# Patient Record
Sex: Male | Born: 1965 | Race: White | Hispanic: No
Health system: Midwestern US, Community
[De-identification: ages and names within clinical notes are randomized; demographics above are authoritative.]

## PROBLEM LIST (undated history)

## (undated) DIAGNOSIS — M5459 Other low back pain: Secondary | ICD-10-CM

## (undated) DIAGNOSIS — Z96641 Presence of right artificial hip joint: Principal | ICD-10-CM

## (undated) DIAGNOSIS — Z1211 Encounter for screening for malignant neoplasm of colon: Secondary | ICD-10-CM

## (undated) DIAGNOSIS — M87051 Idiopathic aseptic necrosis of right femur: Secondary | ICD-10-CM

## (undated) DIAGNOSIS — R197 Diarrhea, unspecified: Secondary | ICD-10-CM

## (undated) DIAGNOSIS — E7800 Pure hypercholesterolemia, unspecified: Secondary | ICD-10-CM

## (undated) DIAGNOSIS — M48061 Spinal stenosis, lumbar region without neurogenic claudication: Secondary | ICD-10-CM

## (undated) DIAGNOSIS — Z01810 Encounter for preprocedural cardiovascular examination: Principal | ICD-10-CM

## (undated) DIAGNOSIS — K219 Gastro-esophageal reflux disease without esophagitis: Secondary | ICD-10-CM

## (undated) DIAGNOSIS — Z9889 Other specified postprocedural states: Secondary | ICD-10-CM

## (undated) DIAGNOSIS — R112 Nausea with vomiting, unspecified: Secondary | ICD-10-CM

## (undated) DIAGNOSIS — F909 Attention-deficit hyperactivity disorder, unspecified type: Secondary | ICD-10-CM

## (undated) DIAGNOSIS — F419 Anxiety disorder, unspecified: Secondary | ICD-10-CM

## (undated) DIAGNOSIS — M1990 Unspecified osteoarthritis, unspecified site: Secondary | ICD-10-CM

## (undated) DIAGNOSIS — Z9289 Personal history of other medical treatment: Secondary | ICD-10-CM

## (undated) DIAGNOSIS — J189 Pneumonia, unspecified organism: Secondary | ICD-10-CM

## (undated) DIAGNOSIS — F319 Bipolar disorder, unspecified: Secondary | ICD-10-CM

## (undated) DIAGNOSIS — S069X9A Unspecified intracranial injury with loss of consciousness of unspecified duration, initial encounter: Secondary | ICD-10-CM

## (undated) DIAGNOSIS — S069X1A Unspecified intracranial injury with loss of consciousness of 30 minutes or less, initial encounter: Secondary | ICD-10-CM

## (undated) DIAGNOSIS — Z8489 Family history of other specified conditions: Secondary | ICD-10-CM

## (undated) DIAGNOSIS — Z87442 Personal history of urinary calculi: Secondary | ICD-10-CM

## (undated) DIAGNOSIS — IMO0001 Reserved for inherently not codable concepts without codable children: Secondary | ICD-10-CM

## (undated) HISTORY — PX: KNEE SURGERY: SHX244

## (undated) HISTORY — PX: OTHER SURGICAL HISTORY: SHX169

---
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
## 2011-09-11 ENCOUNTER — Emergency Department (HOSPITAL_COMMUNITY)
Admission: EM | Admit: 2011-09-11 | Discharge: 2011-09-11 | Disposition: A | Discharge status: Home / Self Care | Payer: Medicaid Other | Attending: Emergency Medicine | Admitting: Emergency Medicine

## 2011-09-11 ENCOUNTER — Emergency Department (HOSPITAL_COMMUNITY): Payer: Medicaid Other

## 2011-09-11 ENCOUNTER — Encounter: Payer: Self-pay | Admitting: *Deleted

## 2011-09-11 DIAGNOSIS — F909 Attention-deficit hyperactivity disorder, unspecified type: Secondary | ICD-10-CM | POA: Insufficient documentation

## 2011-09-11 DIAGNOSIS — F319 Bipolar disorder, unspecified: Secondary | ICD-10-CM | POA: Insufficient documentation

## 2011-09-11 DIAGNOSIS — J45909 Unspecified asthma, uncomplicated: Secondary | ICD-10-CM | POA: Insufficient documentation

## 2011-09-11 DIAGNOSIS — N200 Calculus of kidney: Secondary | ICD-10-CM

## 2011-09-11 DIAGNOSIS — R109 Unspecified abdominal pain: Secondary | ICD-10-CM | POA: Insufficient documentation

## 2011-09-11 HISTORY — DX: Bipolar disorder, unspecified: F31.9

## 2011-09-11 HISTORY — DX: Attention-deficit hyperactivity disorder, unspecified type: F90.9

## 2011-09-11 LAB — COMPREHENSIVE METABOLIC PANEL
ALT: 38 U/L (ref 0–53)
AST: 25 U/L (ref 0–37)
Albumin: 3.8 g/dL (ref 3.5–5.2)
Alkaline Phosphatase: 103 U/L (ref 39–117)
BUN: 13 mg/dL (ref 6–23)
Chloride: 100 mEq/L (ref 96–112)
Potassium: 4.4 mEq/L (ref 3.5–5.1)
Sodium: 138 mEq/L (ref 135–145)
Total Bilirubin: 0.2 mg/dL — ABNORMAL LOW (ref 0.3–1.2)
Total Protein: 7.6 g/dL (ref 6.0–8.3)

## 2011-09-11 LAB — URINALYSIS, ROUTINE W REFLEX MICROSCOPIC
Glucose, UA: NEGATIVE mg/dL
Ketones, ur: NEGATIVE mg/dL
Leukocytes, UA: NEGATIVE
Protein, ur: 30 mg/dL — AB
pH: 6 (ref 5.0–8.0)

## 2011-09-11 LAB — DIFFERENTIAL
Basophils Absolute: 0 10*3/uL (ref 0.0–0.1)
Basophils Relative: 0 % (ref 0–1)
Eosinophils Absolute: 0.1 10*3/uL (ref 0.0–0.7)
Monocytes Relative: 9 % (ref 3–12)
Neutro Abs: 5.8 10*3/uL (ref 1.7–7.7)
Neutrophils Relative %: 62 % (ref 43–77)

## 2011-09-11 LAB — CBC
Hemoglobin: 14.4 g/dL (ref 13.0–17.0)
MCHC: 32.7 g/dL (ref 30.0–36.0)
Platelets: 217 10*3/uL (ref 150–400)

## 2011-09-11 LAB — URINE MICROSCOPIC-ADD ON

## 2011-09-11 MED ORDER — OXYCODONE-ACETAMINOPHEN 5-325 MG PO TABS: 1.0000 | ORAL_TABLET | Freq: Four times a day (QID) | ORAL | Status: AC | PRN

## 2011-09-11 MED ORDER — HYDROMORPHONE HCL PF 1 MG/ML IJ SOLN
1.0000 mg | Freq: Once | INTRAMUSCULAR | Status: AC
  Administered 2011-09-11: 1 mg via INTRAVENOUS
  Filled 2011-09-11: qty 1

## 2011-09-11 MED ORDER — PROMETHAZINE HCL 25 MG PO TABS: 25.0000 mg | ORAL_TABLET | Freq: Four times a day (QID) | ORAL | Status: AC | PRN

## 2011-09-11 MED ORDER — ONDANSETRON HCL 4 MG/2ML IJ SOLN
4.0000 mg | Freq: Once | INTRAMUSCULAR | Status: AC
  Administered 2011-09-11: 4 mg via INTRAVENOUS
  Filled 2011-09-11: qty 2

## 2011-09-11 NOTE — ED Provider Notes (Signed)
History   This chart was scribed for Benny Lennert, MD by Clarita Crane. The patient was seen in room APA11/APA11 and the patient's care was started at 8:58AM.   CSN: [id number] Arrival date & time: 09/11/2011  8:41 AM   First MD Initiated Contact with Patient 09/11/11 [phone]      Chief Complaint  Patient presents with  . Flank Pain    (Consider location/radiation/quality/duration/timing/severity/associated sxs/prior treatment) HPI Raymond Zuniga is a 45 y.o. male who presents to the Emergency Department complaining of intermittent moderate to severe left sided flank pain radiating to LLQ onset 1 week ago but worse last night with no associated symptoms. Patient states episodes of flank pain would usually last 15-20 minutes until pain became more constant and lasting several hours last night. Patient describes pain as sharp and burning and rates pain a 4 out of 10 currently and 10 out of 10 at its worst. Denies fever, chills, urinary frequency, dysuria.    Past Medical History  Diagnosis Date  . Asthma   . Bipolar 1 disorder   . ADHD (attention deficit hyperactivity disorder)     Past Surgical History  Procedure Date  . Knee surgery     left  . Arm surgery     right    No family history on file.  History  Substance Use Topics  . Smoking status: Never Smoker   . Smokeless tobacco: Not on file  . Alcohol Use: No      Review of Systems  Constitutional: Negative for fever, chills and fatigue.  HENT: Negative for congestion, sinus pressure and ear discharge.   Eyes: Negative for discharge.  Respiratory: Negative for cough.   Cardiovascular: Negative for chest pain.  Gastrointestinal: Negative for abdominal pain and diarrhea.  Genitourinary: Positive for flank pain. Negative for dysuria, frequency and hematuria.       Negative urinary frequency  Musculoskeletal: Negative for back pain.  Skin: Negative for rash.  Neurological: Negative for seizures and headaches.    Hematological: Negative.   Psychiatric/Behavioral: Negative for hallucinations.    Allergies  Review of patient's allergies indicates no known allergies.  Home Medications  No current outpatient prescriptions on file.  BP 134/77  Pulse 85  Temp(Src) 97.8 F (36.6 C) (Oral)  Resp 20  Ht 5\' 8"  (1.727 m)  Wt 295 lb (133.811 kg)  BMI 44.85 kg/m2  SpO2 98%  Physical Exam  Nursing note and vitals reviewed. Constitutional: He is oriented to person, place, and time. He appears well-developed and well-nourished. No distress.  HENT:  Head: Normocephalic and atraumatic.  Mouth/Throat: Oropharynx is clear and moist.  Eyes: EOM are normal. Pupils are equal, round, and reactive to light.  Neck: Neck supple. No tracheal deviation present.  Cardiovascular: Normal rate and regular rhythm.  Exam reveals no gallop and no friction rub.   No murmur heard. Pulmonary/Chest: Effort normal. No respiratory distress. He has no wheezes.  Abdominal: Soft. He exhibits no distension. There is no tenderness.  Musculoskeletal: Normal range of motion. He exhibits no edema.  Neurological: He is alert and oriented to person, place, and time. No sensory deficit.  Skin: Skin is warm and dry.  Psychiatric: He has a normal mood and affect. His behavior is normal.    ED Course  Procedures (including critical care time)  DIAGNOSTIC STUDIES: Oxygen Saturation is 98% on room air, normal by my interpretation.    COORDINATION OF CARE:    Labs Reviewed - No data to display  No results found.   No diagnosis found.    MDM  1005- pt has already been evaluated by dr. Estell Harpin.         Worthy Rancher, PA 09/11/11 1012

## 2011-09-11 NOTE — ED Notes (Signed)
C/o left flank pain x 1 wk, worse last night.  Denies n/v/d, denies hematuria.  States is having burning with urination.

## 2011-09-11 NOTE — ED Notes (Signed)
Strainer given to pt

## 2011-09-11 NOTE — Progress Notes (Signed)
Pt was seen with flank pain.  pe tender left flank.  Medical screening examination/treatment/procedure(s) were conducted as a shared visit with non-physician practitioner(s) and myself.  I personally evaluated the patient during the encounter

## 2011-09-11 NOTE — ED Provider Notes (Signed)
Medical screening examination/treatment/procedure(s) were conducted as a shared visit with non-physician practitioner(s) and myself.  I personally evaluated the patient during the encounter Pt with left flank pain.  pe tender left flank  Benny Lennert, MD 09/11/11 1139

## 2011-09-11 NOTE — ED Provider Notes (Deleted)
History     CSN: [id number] Arrival date & time: 09/11/2011  8:41 AM   First MD Initiated Contact with Patient 09/11/11 [phone]      Chief Complaint  Patient presents with  . Flank Pain    (Consider location/radiation/quality/duration/timing/severity/associated sxs/prior treatment) HPI  Past Medical History  Diagnosis Date  . Asthma   . Bipolar 1 disorder   . ADHD (attention deficit hyperactivity disorder)     Past Surgical History  Procedure Date  . Knee surgery     left  . Arm surgery     right    No family history on file.  History  Substance Use Topics  . Smoking status: Never Smoker   . Smokeless tobacco: Not on file  . Alcohol Use: No      Review of Systems  Allergies  Review of patient's allergies indicates no known allergies.  Home Medications   Current Outpatient Rx  Name Route Sig Dispense Refill  . ATOMOXETINE HCL 100 MG PO CAPS Oral Take 100 mg by mouth daily.      . BUPROPION HCL ER (SR) 100 MG PO TB12 Oral Take 100 mg by mouth 2 (two) times daily.      Marland Kitchen LAMOTRIGINE 150 MG PO TABS Oral Take 150 mg by mouth daily.      Marland Kitchen OMEPRAZOLE 20 MG PO CPDR Oral Take 20 mg by mouth 2 (two) times daily.      . OXYCODONE-ACETAMINOPHEN 5-325 MG PO TABS Oral Take 1 tablet by mouth every 6 (six) hours as needed for pain. 6 tablet 0  . PROMETHAZINE HCL 25 MG PO TABS Oral Take 1 tablet (25 mg total) by mouth every 6 (six) hours as needed for nausea. 15 tablet 0    BP 134/77  Pulse 85  Temp(Src) 97.8 F (36.6 C) (Oral)  Resp 20  Ht 5\' 8"  (1.727 m)  Wt 295 lb (133.811 kg)  BMI 44.85 kg/m2  SpO2 98%  Physical Exam  ED Course  Procedures (including critical care time)  11:25am: Recheck: Informed pt of Kidney stone and lab results. Recheck with PCP after passing stone, and return if it doesn't pass.    Labs Reviewed  COMPREHENSIVE METABOLIC PANEL - Abnormal; Notable for the following:    Glucose, Bld 105 (*)    Total Bilirubin 0.2 (*)    All other  components within normal limits  URINALYSIS, ROUTINE W REFLEX MICROSCOPIC - Abnormal; Notable for the following:    Hgb urine dipstick MODERATE (*)    Protein, ur 30 (*)    All other components within normal limits  CBC  DIFFERENTIAL  URINE MICROSCOPIC-ADD ON   Ct Abdomen Pelvis Wo Contrast  09/11/2011  *RADIOLOGY REPORT*  Clinical Data: Severe left flank pain.  CT ABDOMEN AND PELVIS WITHOUT CONTRAST  Technique:  Multidetector CT imaging of the abdomen and pelvis was performed following the standard protocol without intravenous contrast.  Comparison: None.  Findings: Lung bases are clear.  No evidence for free air.  No gross abnormality to the liver, gallbladder, spleen, pancreas or adrenal glands.  There is a punctate stone in the right kidney lower pole without obstruction.  There is a 3 mm stone in the distal left ureter near the left ureterovesical junction and mild dilatation of the left ureter.  Slightly prominent lymph nodes along the pelvic wall, largest measuring up to 1.6 cm on the right. Overall, there is no significant abdominal lymphadenopathy or free fluid.  Normal appearance of the  appendix.  No acute bony abnormality.  IMPRESSION: Mild left hydroureteronephrosis due to a 3 mm stone at the left ureterovesical junction.  Nonobstructive right kidney stone.  Original Report Authenticated By: Richarda Overlie, M.D.     1. Kidney stone       MDM  The chart was scribed for me under my direct supervision.  I personally performed the history, physical, and medical decision making and all procedures in the evaluation of this patient.Benny Lennert, MD 09/11/11 1133  Benny Lennert, MD 09/11/11 1133  Benny Lennert, MD 09/11/11 1134

## 2013-07-29 HISTORY — PX: BACK SURGERY: SHX140

## 2014-02-05 ENCOUNTER — Emergency Department (HOSPITAL_COMMUNITY)
Admission: EM | Admit: 2014-02-05 | Discharge: 2014-02-05 | Disposition: A | Discharge status: Home / Self Care | Payer: Medicaid Other | Attending: Emergency Medicine | Admitting: Emergency Medicine

## 2014-02-05 ENCOUNTER — Encounter (HOSPITAL_COMMUNITY): Payer: Self-pay | Admitting: Emergency Medicine

## 2014-02-05 DIAGNOSIS — F319 Bipolar disorder, unspecified: Secondary | ICD-10-CM | POA: Insufficient documentation

## 2014-02-05 DIAGNOSIS — F909 Attention-deficit hyperactivity disorder, unspecified type: Secondary | ICD-10-CM | POA: Insufficient documentation

## 2014-02-05 DIAGNOSIS — J45909 Unspecified asthma, uncomplicated: Secondary | ICD-10-CM | POA: Insufficient documentation

## 2014-02-05 DIAGNOSIS — Z9889 Other specified postprocedural states: Secondary | ICD-10-CM | POA: Insufficient documentation

## 2014-02-05 DIAGNOSIS — M79609 Pain in unspecified limb: Secondary | ICD-10-CM | POA: Insufficient documentation

## 2014-02-05 DIAGNOSIS — M545 Low back pain, unspecified: Secondary | ICD-10-CM | POA: Insufficient documentation

## 2014-02-05 DIAGNOSIS — M25559 Pain in unspecified hip: Secondary | ICD-10-CM

## 2014-02-05 DIAGNOSIS — Z79899 Other long term (current) drug therapy: Secondary | ICD-10-CM | POA: Insufficient documentation

## 2014-02-05 LAB — URINALYSIS, ROUTINE W REFLEX MICROSCOPIC
BILIRUBIN URINE: NEGATIVE
Glucose, UA: NEGATIVE mg/dL
HGB URINE DIPSTICK: NEGATIVE
Ketones, ur: NEGATIVE mg/dL
Leukocytes, UA: NEGATIVE
Nitrite: NEGATIVE
Protein, ur: NEGATIVE mg/dL
SPECIFIC GRAVITY, URINE: 1.025 (ref 1.005–1.030)
UROBILINOGEN UA: 0.2 mg/dL (ref 0.0–1.0)
pH: 6 (ref 5.0–8.0)

## 2014-02-05 MED ORDER — OXYCODONE-ACETAMINOPHEN 5-325 MG PO TABS
1.0000 | ORAL_TABLET | Freq: Once | ORAL | Status: AC
  Administered 2014-02-05: 1 via ORAL
  Filled 2014-02-05: qty 1

## 2014-02-05 MED ORDER — IBUPROFEN 600 MG PO TABS: 600.0000 mg | ORAL_TABLET | Freq: Four times a day (QID) | ORAL | Status: AC | PRN

## 2014-02-05 NOTE — ED Notes (Signed)
Pt c/o right hip, groin and back pain x2-3 months. Pt states pain began while lifting boxes at work. Pain has gradually worsened.

## 2014-02-05 NOTE — Discharge Instructions (Signed)
Arthralgia  Arthralgia is joint pain. A joint is a place where two bones meet. Joint pain can happen for many reasons. The joint can be bruised, stiff, infected, or weak from aging. Pain usually goes away after resting and taking medicine for soreness.   HOME CARE  · Rest the joint as told by your doctor.  · Keep the sore joint raised (elevated) for the first 24 hours.  · Put ice on the joint area.  · Put ice in a plastic bag.  · Place a towel between your skin and the bag.  · Leave the ice on for 15-20 minutes, 03-04 times a day.  · Wear your splint, casting, elastic bandage, or sling as told by your doctor.  · Only take medicine as told by your doctor. Do not take aspirin.  · Use crutches as told by your doctor. Do not put weight on the joint until told to by your doctor.  GET HELP RIGHT AWAY IF:   · You have bruising, puffiness (swelling), or more pain.  · Your fingers or toes turn blue or start to lose feeling (numb).  · Your medicine does not lessen the pain.  · Your pain becomes severe.  · You have a temperature by mouth above 102° F (38.9° C), not controlled by medicine.  · You cannot move or use the joint.  MAKE SURE YOU:   · Understand these instructions.  · Will watch your condition.  · Will get help right away if you are not doing well or get worse.  Document Released: 09/02/2009 Document Revised: 12/07/2011 Document Reviewed: 09/02/2009  [employer]® Patient Information ©2014 [employer].

## 2014-02-05 NOTE — ED Notes (Signed)
Right groin pain, radiates to right lower back

## 2014-02-05 NOTE — ED Provider Notes (Signed)
CSN: [id number]     Arrival date & time 02/05/14  [id number] History  This chart was scribed for Joya Gaskinsonald W Tangala Wiegert, MD by Bennett Scrapehristina Taylor, ED Scribe. This patient was seen in room APA11/APA11 and the patient's care was started at 10:50 AM.   Chief Complaint  Patient presents with  . Hip Pain    pain in right groin also     Patient is a 48 y.o. male presenting with back pain. The history is provided by the patient. No language interpreter was used.  Back Pain Location:  Lumbar spine Quality:  Aching Radiates to: right groin and testicles. Pain severity:  Moderate Pain is:  Same all the time Onset quality:  Gradual Duration:  3 months Timing:  Constant Context: not falling   Relieved by:  Cold packs and bed rest Worsened by:  Ambulation Associated symptoms: no dysuria, no fever, no numbness and no weakness     HPI Comments: Raymond Zuniga is a 48 y.o. male who presents to the Emergency Department complaining of right lower back pain that radiates through the right inguinal region to the testicles that has been ongoing for the past 3 months. He describes the pain as an aching sensation with a pressure sensation around his testicles. He states that rest and ice with improvement but pain would return with mild exertion and ambulation. He reports that the pain started 2 months after a lumbar decompression surgery in [place] 5 months ago. He states that he has a h/o kidney stones and was seen at [facility] the same with negative radiology reports for kidney stones. A right hip x-ray done at the time was also negative. He reports that he loses his balance frequently since the surgery burt denies any recent falls. He is currently on Norco, Flexeril and Tramadol for his chronic back pain but states that these have not improved his symptoms. He denies any fevers, dysuria, difficulty urinating, or extremity weakness or numbness.  No PCP currently. Dr. Edyth GunnelsBaron, surgeon, prescribes pain medications.  Past  Medical History  Diagnosis Date  . Asthma   . Bipolar 1 disorder   . ADHD (attention deficit hyperactivity disorder)    Past Surgical History  Procedure Laterality Date  . Knee surgery      left  . Arm surgery      right  . Back surgery  07/2013    decompression L1-L4   History reviewed. No pertinent family history. History  Substance Use Topics  . Smoking status: Never Smoker   . Smokeless tobacco: Not on file  . Alcohol Use: No    Review of Systems  Constitutional: Negative for fever.  Genitourinary: Negative for dysuria.  Musculoskeletal: Positive for back pain.  Neurological: Negative for weakness and numbness.  All other systems reviewed and are negative.   Allergies  Review of patient's allergies indicates no known allergies.  Home Medications   Prior to Admission medications   Medication Sig Start Date End Date Taking? Authorizing Provider  albuterol (PROVENTIL HFA;VENTOLIN HFA) 108 (90 BASE) MCG/ACT inhaler Inhale 2 puffs into the lungs every 6 (six) hours as needed for wheezing or shortness of breath.   Yes Historical Provider, MD  amphetamine-dextroamphetamine (ADDERALL) 20 MG tablet Take 20 mg by mouth 2 (two) times daily.   Yes Historical Provider, MD  buPROPion (WELLBUTRIN SR) 100 MG 12 hr tablet Take 100 mg by mouth 2 (two) times daily.     Yes Historical Provider, MD  cyclobenzaprine (FLEXERIL) 5 MG tablet  Take 5 mg by mouth 2 (two) times daily as needed for muscle spasms.   Yes Historical Provider, MD  HYDROcodone-acetaminophen (NORCO/VICODIN) 5-325 MG per tablet Take 1-2 tablets by mouth every 6 (six) hours as needed for moderate pain.   Yes Historical Provider, MD  ibuprofen (ADVIL,MOTRIN) 200 MG tablet Take 600-800 mg by mouth daily as needed for moderate pain.   Yes Historical Provider, MD  traMADol (ULTRAM) 50 MG tablet Take 100 mg by mouth every 6 (six) hours as needed for moderate pain.   Yes Historical Provider, MD   Triage Vitals: BP 132/92   Pulse 70  Temp(Src) 98 F (36.7 C) (Oral)  Resp 20  Ht 5\' 8"  (1.727 m)  Wt 270 lb (122.471 kg)  BMI 41.06 kg/m2  SpO2 98%  Physical Exam  Nursing note and vitals reviewed.  CONSTITUTIONAL: Well developed/well nourished HEAD: Normocephalic/atraumatic EYES: EOMI/PERRL ENMT: Mucous membranes moist NECK: supple no meningeal signs SPINE:entire spine nontender, well healed lumbar scar noted, No bruising/crepitance/stepoffs noted to spine CV: S1/S2 noted, no murmurs/rubs/gallops noted LUNGS: Lungs are clear to auscultation bilaterally, no apparent distress ABDOMEN: soft, nontender, no rebound or guarding GU:no cva tenderness, no inguinal hernia or scrotal tenderness, chaperone present and family present at patient  NEURO: Pt is awake/alert, moves all extremitiesx4, equal power hip flexion/extension, knee flexion/extension, equal power with foot flexion/extension. Pt can ambulate well in the ED EXTREMITIES: pulses normal, full ROM, no deformity noted and mild tenderness to ROM of right hip SKIN: warm, color normal PSYCH: no abnormalities of mood noted  ED Course  Procedures   Medications  oxyCODONE-acetaminophen (PERCOCET/ROXICET) 5-325 MG per tablet 1 tablet (not administered)    DIAGNOSTIC STUDIES: Oxygen Saturation is 98% on RA, normal by my interpretation.    COORDINATION OF CARE: 10:52 AM-Discussed treatment plan which includes pain meds and UA with pt at bedside and pt agreed to plan.   This is essentially chronic pain.  Advised need for f/u as outpatient   Labs Review Labs Reviewed  URINALYSIS, ROUTINE W REFLEX MICROSCOPIC     MDM   Final diagnoses:  Hip pain    Nursing notes including past medical history and social history reviewed and considered in documentation Labs/vital reviewed and considered [facility] and [place] narcotic database reviewed    I personally performed the services described in this documentation, which was scribed in my presence. The recorded  information has been reviewed and is accurate.      Joya Gaskinsonald W Reshad Saab, MD 02/05/14 [phone]

## 2014-05-08 ENCOUNTER — Ambulatory Visit (INDEPENDENT_AMBULATORY_CARE_PROVIDER_SITE_OTHER): Payer: Medicaid Other | Admitting: Orthopedic Surgery

## 2014-05-08 VITALS — BP 145/98 | Ht 68.0 in | Wt 270.0 lb

## 2014-05-08 DIAGNOSIS — R109 Unspecified abdominal pain: Secondary | ICD-10-CM

## 2014-05-08 DIAGNOSIS — R1031 Right lower quadrant pain: Secondary | ICD-10-CM

## 2014-05-08 DIAGNOSIS — M5410 Radiculopathy, site unspecified: Secondary | ICD-10-CM

## 2014-05-08 DIAGNOSIS — IMO0002 Reserved for concepts with insufficient information to code with codable children: Secondary | ICD-10-CM

## 2014-05-08 NOTE — Progress Notes (Signed)
Subjective:     Patient ID: Raymond Zuniga, male   DOB: 1965/11/22, 48 y.o.   MRN: [id number]  HPI Chief Complaint  Patient presents with  . Hip Pain    Right hip pain, no known injury.... ref- [facility]   Dr. Trilby LeaverJillian VanHorn  Right hip pain from the small of her back to the groin. The patient is status post L2-3 L3-4 and L4-5 lumbar fusion recently in [place]. The patient went back to work started lifting boxes again felt something in his groin and since that time his head stiffness in his right hip and groin giving way symptoms of the right lower extremity with constant disabling pain which is 8/10. He went to the hospital for possible kidney stone CT scan was negative he had a negative hip x-ray which I've reviewed. He's been on Tylenol, ibuprofen, tramadol he still has significant pain. He has pain with standing bending sitting too long and getting out of a chair.  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 Depression history of fracture mental illness arthritis  Previous surgeries include nasal surgery in 1981 Right Pl. left leg 1995 teeth removed for dentures and 2008 biceps tendon repair 2010 spinal decompression fusion 2014  Current medications Adderall, Wellbutrin, ibuprofen and Tylenol. No medication allergies  Family history of lung disease alcoholism arthritis and mental illness  Review of systems positive findings include difficulties with hearing loss dental issues breathing difficulties heartburn musculoskeletal complaints as described. Visual disturbances. Balance problems numbness tingling depression anxiety otherwise normal    Review of Systems     Objective:   Physical Exam BP 145/98  Ht 5\' 8"  (1.727 m)  Wt 270 lb (122.471 kg)  BMI 41.06 kg/m2 General the patient is well-developed and well-nourished grooming and hygiene are normal Oriented x3 Mood and affect normal Ambulatory status he is ambulatory without assistive  devices with a slight limp from his right lower [place]  Has tenderness in his lower back.  His lower extremities exhibit normal range of motion his hip normal logroll no. No tenderness in the musculature surrounding the groin. Her hip flexion is normal. Hip joints are stable. No motor weakness in hip flexors. 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    Plan:     I recommend he see his back specialist again to have a further workup.

## 2014-05-08 NOTE — Patient Instructions (Signed)
No follow up needed

## 2014-08-28 ENCOUNTER — Other Ambulatory Visit: Payer: Self-pay | Admitting: Neurosurgery

## 2014-08-28 DIAGNOSIS — M5126 Other intervertebral disc displacement, lumbar region: Secondary | ICD-10-CM

## 2014-09-14 ENCOUNTER — Ambulatory Visit
Admission: RE | Admit: 2014-09-14 | Discharge: 2014-09-14 | Disposition: A | Discharge status: Home / Self Care | Payer: Medicaid Other | Source: Ambulatory Visit | Attending: Neurosurgery | Admitting: Neurosurgery

## 2014-09-14 DIAGNOSIS — M5126 Other intervertebral disc displacement, lumbar region: Secondary | ICD-10-CM

## 2014-09-14 MED ORDER — IOHEXOL 180 MG/ML  SOLN
17.0000 mL | Freq: Once | INTRAMUSCULAR | Status: AC | PRN
  Administered 2014-09-14: 17 mL via INTRATHECAL

## 2014-09-14 MED ORDER — DIAZEPAM 5 MG PO TABS
10.0000 mg | ORAL_TABLET | Freq: Once | ORAL | Status: AC
  Administered 2014-09-14: 10 mg via ORAL

## 2014-09-14 NOTE — Discharge Instructions (Signed)
Myelogram Discharge Instructions  1. Go home and rest quietly for the next 24 hours.  It is important to lie flat for the next 24 hours.  Get up only to go to the restroom.  You may lie in the bed or on a couch on your back, your stomach, your left side or your right side.  You may have one pillow under your head.  You may have pillows between your knees while you are on your side or under your knees while you are on your back.  2. DO NOT drive today.  Recline the seat as far back as it will go, while still wearing your seat belt, on the way home.  3. You may get up to go to the bathroom as needed.  You may sit up for 10 minutes to eat.  You may resume your normal diet and medications unless otherwise indicated.  Drink lots of extra fluids today and tomorrow.  4. The incidence of headache, nausea, or vomiting is about 5% (one in 20 patients).  If you develop a headache, lie flat and drink plenty of fluids until the headache goes away.  Caffeinated beverages may be helpful.  If you develop severe nausea and vomiting or a headache that does not go away with flat bed rest, call [phone].  5. You may resume normal activities after your 24 hours of bed rest is over; however, do not exert yourself strongly or do any heavy lifting tomorrow. If when you get up you have a headache when standing, go back to bed and force fluids for another 24 hours.  6. Call your physician for a follow-up appointment.  The results of your myelogram will be sent directly to your physician by the following day.  7. If you have any questions or if complications develop after you arrive home, please call [phone].  Discharge instructions have been explained to the patient.  The patient, or the person responsible for the patient, fully understands these instructions.    may resume Adderall, Tramadol and Bupropion on Dec. 19, 2015, after 1:00 pm.

## 2014-09-14 NOTE — Progress Notes (Signed)
Patient states he has been off Adderall, Bupropion and Tramadol for at least the past two days.

## 2014-09-27 ENCOUNTER — Other Ambulatory Visit (HOSPITAL_COMMUNITY): Payer: Self-pay | Admitting: Neurosurgery

## 2014-10-09 NOTE — Pre-Procedure Instructions (Addendum)
Raymond Zuniga  10/09/2014   Your procedure is scheduled on:  Wednesday, January 20th  Report [facility] Admitting at 630 AM.  Call this number if you have problems the morning of surgery: [phone]   Remember:   Do not eat food or drink liquids after midnight.   Take these medicines the morning of surgery with A SIP OF WATER: wellbutrin, ultram if needed, albuterol if needed  Stop taking Aspirin, Aleve, Ibuprofen, BC's, Goody's, Herbal medications, Fish Oil, Meloxicam (Mobic) 7 days prior to your surgery   Do not wear jewelry  Do not wear lotions, powders, or perfumes.deodorant.  Do not shave 48 hours prior to surgery. Men may shave face and neck.  Do not bring valuables to the hospital.  [facility] is not responsible  for any belongings or valuables.               Contacts, dentures or bridgework may not be worn into surgery.  Leave suitcase in the car. After surgery it may be brought to your room.  For patients admitted to the hospital, discharge time is determined by your  treatment team.        Please read over the following fact sheets that you were given: Pain Booklet, Coughing and Deep Breathing, Blood Transfusion Information, MRSA Information and Surgical Site Infection Prevention  [facility] - Preparing for Surgery  Before surgery, you can play an important role.  Because skin is not sterile, your skin needs to be as free of germs as possible.  You can reduce the number of germs on you skin by washing with CHG (chlorahexidine gluconate) soap before surgery.  CHG is an antiseptic cleaner which kills germs and bonds with the skin to continue killing germs even after washing.  Please DO NOT use if you have an allergy to CHG or antibacterial soaps.  If your skin becomes reddened/irritated stop using the CHG and inform your nurse when you arrive at Short Stay.  Do not shave (including legs and underarms) for at least 48 hours prior to the first CHG shower.  You  may shave your face.  Please follow these instructions carefully:   1.  Shower with CHG Soap the night before surgery and the morning of Surgery.  2.  If you choose to wash your hair, wash your hair first as usual with your normal shampoo.  3.  After you shampoo, rinse your hair and body thoroughly to remove the shampoo.  4.  Use CHG as you would any other liquid soap.  You can apply CHG directly to the skin and wash gently with scrungie or a clean washcloth.  5.  Apply the CHG Soap to your body ONLY FROM THE NECK DOWN.  Do not use on open wounds or open sores.  Avoid contact with your eyes, ears, mouth and genitals (private parts).  Wash genitals (private parts) with your normal soap.  6.  Wash thoroughly, paying special attention to the area where your surgery will be performed.  7.  Thoroughly rinse your body with warm water from the neck down.  8.  DO NOT shower/wash with your normal soap after using and rinsing off the CHG Soap.  9.  Pat yourself dry with a clean towel.            10.  Wear clean pajamas.            11.  Place clean sheets on your bed the night of your  first shower and do not sleep with pets.  Day of Surgery  Do not apply any lotions/deoderants the morning of surgery.  Please wear clean clothes to the hospital/surgery center.

## 2014-10-10 ENCOUNTER — Encounter (HOSPITAL_COMMUNITY): Payer: Self-pay

## 2014-10-10 ENCOUNTER — Encounter (HOSPITAL_COMMUNITY)
Admission: RE | Admit: 2014-10-10 | Discharge: 2014-10-10 | Disposition: A | Discharge status: Home / Self Care | Payer: Medicaid Other | Source: Ambulatory Visit | Attending: Neurosurgery | Admitting: Neurosurgery

## 2014-10-10 DIAGNOSIS — Z01812 Encounter for preprocedural laboratory examination: Secondary | ICD-10-CM | POA: Insufficient documentation

## 2014-10-10 DIAGNOSIS — Z01818 Encounter for other preprocedural examination: Secondary | ICD-10-CM | POA: Diagnosis not present

## 2014-10-10 DIAGNOSIS — M4316 Spondylolisthesis, lumbar region: Secondary | ICD-10-CM | POA: Insufficient documentation

## 2014-10-10 HISTORY — DX: Other specified postprocedural states: Z98.890

## 2014-10-10 HISTORY — DX: Unspecified osteoarthritis, unspecified site: M19.90

## 2014-10-10 HISTORY — DX: Anxiety disorder, unspecified: F41.9

## 2014-10-10 HISTORY — DX: Gastro-esophageal reflux disease without esophagitis: K21.9

## 2014-10-10 HISTORY — DX: Nausea with vomiting, unspecified: R11.2

## 2014-10-10 HISTORY — DX: Pneumonia, unspecified organism: J18.9

## 2014-10-10 LAB — BASIC METABOLIC PANEL
ANION GAP: 9 (ref 5–15)
BUN: 14 mg/dL (ref 6–23)
CO2: 25 mmol/L (ref 19–32)
Calcium: 8.7 mg/dL (ref 8.4–10.5)
Chloride: 99 mEq/L (ref 96–112)
Creatinine, Ser: 0.99 mg/dL (ref 0.50–1.35)
GFR calc non Af Amer: >90 mL/min (ref >90)
Glucose, Bld: 83 mg/dL (ref 70–99)
POTASSIUM: 4.4 mmol/L (ref 3.5–5.1)
SODIUM: 133 mmol/L — AB (ref 135–145)

## 2014-10-10 LAB — CBC
HCT: 42.6 % (ref 39.0–52.0)
Hemoglobin: 14.5 g/dL (ref 13.0–17.0)
MCH: 30.9 pg (ref 26.0–34.0)
MCHC: 34 g/dL (ref 30.0–36.0)
MCV: 90.8 fL (ref 78.0–100.0)
Platelets: 220 10*3/uL (ref 150–400)
RBC: 4.69 MIL/uL (ref 4.22–5.81)
RDW: 13.5 % (ref 11.5–15.5)
WBC: 7.4 10*3/uL (ref 4.0–10.5)

## 2014-10-10 LAB — SURGICAL PCR SCREEN
MRSA, PCR: NEGATIVE
Staphylococcus aureus: NEGATIVE

## 2014-10-10 LAB — TYPE AND SCREEN
ABO/RH(D): O POS
Antibody Screen: NEGATIVE

## 2014-10-10 LAB — ABO/RH: ABO/RH(D): O POS

## 2014-10-10 NOTE — Progress Notes (Addendum)
PCP is Dr. Clovis PuVan Horn Denies seeing a cardiologist. Denies ever having a stress test, echo, or card cath. Denies having a recent EKG or CXR. Denies chest pain, or any cardiac problems.

## 2014-10-17 ENCOUNTER — Encounter (HOSPITAL_COMMUNITY)
Admission: RE | Disposition: A | Discharge status: Home / Self Care | Payer: Medicaid Other | Source: Ambulatory Visit | Attending: Neurosurgery

## 2014-10-17 ENCOUNTER — Inpatient Hospital Stay (HOSPITAL_COMMUNITY): Payer: Medicaid Other

## 2014-10-17 ENCOUNTER — Encounter (HOSPITAL_COMMUNITY): Payer: Self-pay | Admitting: *Deleted

## 2014-10-17 ENCOUNTER — Inpatient Hospital Stay (HOSPITAL_COMMUNITY)
Admission: RE | Admit: 2014-10-17 | Discharge: 2014-10-18 | DRG: 460 | Disposition: A | Discharge status: Home / Self Care | Payer: Medicaid Other | Source: Ambulatory Visit | Attending: Neurosurgery | Admitting: Neurosurgery

## 2014-10-17 ENCOUNTER — Inpatient Hospital Stay (HOSPITAL_COMMUNITY): Payer: Medicaid Other | Admitting: Anesthesiology

## 2014-10-17 DIAGNOSIS — J45909 Unspecified asthma, uncomplicated: Secondary | ICD-10-CM | POA: Diagnosis present

## 2014-10-17 DIAGNOSIS — Z79899 Other long term (current) drug therapy: Secondary | ICD-10-CM | POA: Diagnosis not present

## 2014-10-17 DIAGNOSIS — M1990 Unspecified osteoarthritis, unspecified site: Secondary | ICD-10-CM | POA: Diagnosis present

## 2014-10-17 DIAGNOSIS — F909 Attention-deficit hyperactivity disorder, unspecified type: Secondary | ICD-10-CM | POA: Diagnosis present

## 2014-10-17 DIAGNOSIS — F319 Bipolar disorder, unspecified: Secondary | ICD-10-CM | POA: Diagnosis present

## 2014-10-17 DIAGNOSIS — M48061 Spinal stenosis, lumbar region without neurogenic claudication: Secondary | ICD-10-CM | POA: Diagnosis present

## 2014-10-17 DIAGNOSIS — K219 Gastro-esophageal reflux disease without esophagitis: Secondary | ICD-10-CM | POA: Diagnosis present

## 2014-10-17 DIAGNOSIS — Z791 Long term (current) use of non-steroidal anti-inflammatories (NSAID): Secondary | ICD-10-CM

## 2014-10-17 DIAGNOSIS — M79606 Pain in leg, unspecified: Secondary | ICD-10-CM | POA: Diagnosis present

## 2014-10-17 DIAGNOSIS — F419 Anxiety disorder, unspecified: Secondary | ICD-10-CM | POA: Diagnosis present

## 2014-10-17 DIAGNOSIS — Z888 Allergy status to other drugs, medicaments and biological substances status: Secondary | ICD-10-CM | POA: Diagnosis not present

## 2014-10-17 DIAGNOSIS — M4310 Spondylolisthesis, site unspecified: Secondary | ICD-10-CM

## 2014-10-17 DIAGNOSIS — M4806 Spinal stenosis, lumbar region: Principal | ICD-10-CM | POA: Diagnosis present

## 2014-10-17 DIAGNOSIS — M4316 Spondylolisthesis, lumbar region: Secondary | ICD-10-CM | POA: Diagnosis present

## 2014-10-17 SURGERY — POSTERIOR LUMBAR FUSION 1 LEVEL
Anesthesia: General | Site: Back | Laterality: Bilateral

## 2014-10-17 MED ORDER — OXYCODONE-ACETAMINOPHEN 5-325 MG PO TABS
1.0000 | ORAL_TABLET | ORAL | Status: DC | PRN
  Administered 2014-10-17 – 2014-10-18 (×4): 2 via ORAL
  Filled 2014-10-17 (×4): qty 2

## 2014-10-17 MED ORDER — CYCLOBENZAPRINE HCL 10 MG PO TABS: 10.0000 mg | ORAL_TABLET | Freq: Four times a day (QID) | ORAL | Status: DC | PRN

## 2014-10-17 MED ORDER — LIDOCAINE HCL (CARDIAC) 20 MG/ML IV SOLN
INTRAVENOUS | Status: DC | PRN
  Administered 2014-10-17: 100 mg via INTRAVENOUS

## 2014-10-17 MED ORDER — SODIUM CHLORIDE 0.9 % IJ SOLN
INTRAMUSCULAR | Status: AC
  Filled 2014-10-17: qty 10

## 2014-10-17 MED ORDER — DEXAMETHASONE SODIUM PHOSPHATE 4 MG/ML IJ SOLN
INTRAMUSCULAR | Status: AC
  Filled 2014-10-17: qty 2

## 2014-10-17 MED ORDER — NEOSTIGMINE METHYLSULFATE 10 MG/10ML IV SOLN
INTRAVENOUS | Status: DC | PRN
  Administered 2014-10-17: 5 mg via INTRAVENOUS

## 2014-10-17 MED ORDER — CEFAZOLIN SODIUM-DEXTROSE 2-3 GM-% IV SOLR
2.0000 g | Freq: Once | INTRAVENOUS | Status: AC
  Administered 2014-10-17: 2 g via INTRAVENOUS
  Filled 2014-10-17: qty 50

## 2014-10-17 MED ORDER — SURGIFOAM 100 EX MISC
CUTANEOUS | Status: DC | PRN
  Administered 2014-10-17: 10:00:00 via TOPICAL

## 2014-10-17 MED ORDER — FENTANYL CITRATE 0.05 MG/ML IJ SOLN
INTRAMUSCULAR | Status: DC | PRN
  Administered 2014-10-17: 100 ug via INTRAVENOUS
  Administered 2014-10-17 (×2): 50 ug via INTRAVENOUS
  Administered 2014-10-17: 25 ug via INTRAVENOUS
  Administered 2014-10-17 (×2): 50 ug via INTRAVENOUS
  Administered 2014-10-17: 100 ug via INTRAVENOUS
  Administered 2014-10-17: 25 ug via INTRAVENOUS
  Administered 2014-10-17: 100 ug via INTRAVENOUS

## 2014-10-17 MED ORDER — HYDROMORPHONE HCL 1 MG/ML IJ SOLN
0.2500 mg | INTRAMUSCULAR | Status: DC | PRN
  Administered 2014-10-17 (×2): 0.5 mg via INTRAVENOUS

## 2014-10-17 MED ORDER — GLYCOPYRROLATE 0.2 MG/ML IJ SOLN
INTRAMUSCULAR | Status: AC
  Filled 2014-10-17: qty 3

## 2014-10-17 MED ORDER — ROCURONIUM BROMIDE 100 MG/10ML IV SOLN
INTRAVENOUS | Status: DC | PRN
Start: 2014-10-17 — End: 2014-10-17
  Administered 2014-10-17: 5 mg via INTRAVENOUS
  Administered 2014-10-17 (×2): 10 mg via INTRAVENOUS
  Administered 2014-10-17: 20 mg via INTRAVENOUS
  Administered 2014-10-17: 5 mg via INTRAVENOUS
  Administered 2014-10-17: 10 mg via INTRAVENOUS
  Administered 2014-10-17: 30 mg via INTRAVENOUS

## 2014-10-17 MED ORDER — ROCURONIUM BROMIDE 50 MG/5ML IV SOLN
INTRAVENOUS | Status: AC
  Filled 2014-10-17: qty 1

## 2014-10-17 MED ORDER — EPHEDRINE SULFATE 50 MG/ML IJ SOLN
INTRAMUSCULAR | Status: DC | PRN
  Administered 2014-10-17: 10 mg via INTRAVENOUS
  Administered 2014-10-17: 5 mg via INTRAVENOUS

## 2014-10-17 MED ORDER — SUCCINYLCHOLINE CHLORIDE 20 MG/ML IJ SOLN
INTRAMUSCULAR | Status: DC | PRN
  Administered 2014-10-17: 100 mg via INTRAVENOUS

## 2014-10-17 MED ORDER — LIDOCAINE HCL (CARDIAC) 20 MG/ML IV SOLN
INTRAVENOUS | Status: AC
  Filled 2014-10-17: qty 5

## 2014-10-17 MED ORDER — LIDOCAINE-EPINEPHRINE 1 %-1:100000 IJ SOLN
INTRAMUSCULAR | Status: DC | PRN
  Administered 2014-10-17: 8 mL

## 2014-10-17 MED ORDER — ALBUMIN HUMAN 5 % IV SOLN
INTRAVENOUS | Status: DC | PRN
  Administered 2014-10-17: 12:00:00 via INTRAVENOUS

## 2014-10-17 MED ORDER — SODIUM CHLORIDE 0.9 % IV SOLN
250.0000 mL | INTRAVENOUS | Status: DC
  Administered 2014-10-17: 250 mL via INTRAVENOUS

## 2014-10-17 MED ORDER — DEXAMETHASONE SODIUM PHOSPHATE 4 MG/ML IJ SOLN
INTRAMUSCULAR | Status: DC | PRN
  Administered 2014-10-17: 8 mg via INTRAVENOUS

## 2014-10-17 MED ORDER — FENTANYL CITRATE 0.05 MG/ML IJ SOLN
INTRAMUSCULAR | Status: AC
  Filled 2014-10-17: qty 5

## 2014-10-17 MED ORDER — ARTIFICIAL TEARS OP OINT
TOPICAL_OINTMENT | OPHTHALMIC | Status: DC | PRN
  Administered 2014-10-17: 1 via OPHTHALMIC

## 2014-10-17 MED ORDER — ALBUTEROL SULFATE (2.5 MG/3ML) 0.083% IN NEBU: 2.5000 mg | INHALATION_SOLUTION | Freq: Four times a day (QID) | RESPIRATORY_TRACT | Status: DC | PRN

## 2014-10-17 MED ORDER — AMPHETAMINE-DEXTROAMPHETAMINE 10 MG PO TABS
20.0000 mg | ORAL_TABLET | Freq: Two times a day (BID) | ORAL | Status: DC
  Administered 2014-10-17 – 2014-10-18 (×2): 20 mg via ORAL
  Filled 2014-10-17 (×2): qty 2

## 2014-10-17 MED ORDER — MIDAZOLAM HCL 2 MG/2ML IJ SOLN
INTRAMUSCULAR | Status: AC
  Filled 2014-10-17: qty 2

## 2014-10-17 MED ORDER — CEFAZOLIN SODIUM-DEXTROSE 2-3 GM-% IV SOLR
INTRAVENOUS | Status: AC
  Filled 2014-10-17: qty 50

## 2014-10-17 MED ORDER — PROPOFOL 10 MG/ML IV BOLUS
INTRAVENOUS | Status: DC | PRN
  Administered 2014-10-17: 200 mg via INTRAVENOUS

## 2014-10-17 MED ORDER — PROMETHAZINE HCL 25 MG/ML IJ SOLN: 6.2500 mg | INTRAMUSCULAR | Status: DC | PRN

## 2014-10-17 MED ORDER — ACETAMINOPHEN 650 MG RE SUPP: 650.0000 mg | RECTAL | Status: DC | PRN

## 2014-10-17 MED ORDER — BACITRACIN 50000 UNITS IM SOLR
INTRAMUSCULAR | Status: DC | PRN
  Administered 2014-10-17: 10:00:00

## 2014-10-17 MED ORDER — PHENYLEPHRINE HCL 10 MG/ML IJ SOLN
INTRAMUSCULAR | Status: AC
  Filled 2014-10-17: qty 1

## 2014-10-17 MED ORDER — ACETAMINOPHEN 325 MG PO TABS: 650.0000 mg | ORAL_TABLET | ORAL | Status: DC | PRN

## 2014-10-17 MED ORDER — HYDROMORPHONE HCL 1 MG/ML IJ SOLN
0.5000 mg | INTRAMUSCULAR | Status: DC | PRN
  Administered 2014-10-17: 1 mg via INTRAVENOUS
  Filled 2014-10-17: qty 1

## 2014-10-17 MED ORDER — LACTATED RINGERS IV SOLN
INTRAVENOUS | Status: DC | PRN
  Administered 2014-10-17 (×3): via INTRAVENOUS

## 2014-10-17 MED ORDER — CEFAZOLIN SODIUM-DEXTROSE 2-3 GM-% IV SOLR
2.0000 g | Freq: Three times a day (TID) | INTRAVENOUS | Status: DC
  Administered 2014-10-17 – 2014-10-18 (×3): 2 g via INTRAVENOUS
  Filled 2014-10-17 (×6): qty 50

## 2014-10-17 MED ORDER — LORATADINE 10 MG PO TABS: 10.0000 mg | ORAL_TABLET | Freq: Every day | ORAL | Status: DC | PRN

## 2014-10-17 MED ORDER — MENTHOL 3 MG MT LOZG: 1.0000 | LOZENGE | OROMUCOSAL | Status: DC | PRN

## 2014-10-17 MED ORDER — GLYCOPYRROLATE 0.2 MG/ML IJ SOLN
INTRAMUSCULAR | Status: DC | PRN
  Administered 2014-10-17 (×2): 0.2 mg via INTRAVENOUS
  Administered 2014-10-17: 0.6 mg via INTRAVENOUS

## 2014-10-17 MED ORDER — SODIUM CHLORIDE 0.9 % IJ SOLN: 3.0000 mL | INTRAMUSCULAR | Status: DC | PRN

## 2014-10-17 MED ORDER — PHENYLEPHRINE HCL 10 MG/ML IJ SOLN
INTRAMUSCULAR | Status: DC | PRN
  Administered 2014-10-17: 40 ug via INTRAVENOUS
  Administered 2014-10-17: 80 ug via INTRAVENOUS

## 2014-10-17 MED ORDER — DOCUSATE SODIUM 100 MG PO CAPS
100.0000 mg | ORAL_CAPSULE | Freq: Two times a day (BID) | ORAL | Status: DC
  Administered 2014-10-17 – 2014-10-18 (×3): 100 mg via ORAL
  Filled 2014-10-17 (×3): qty 1

## 2014-10-17 MED ORDER — BUPROPION HCL ER (SR) 100 MG PO TB12
100.0000 mg | ORAL_TABLET | Freq: Two times a day (BID) | ORAL | Status: DC
  Administered 2014-10-17 – 2014-10-18 (×3): 100 mg via ORAL
  Filled 2014-10-17 (×4): qty 1

## 2014-10-17 MED ORDER — ARTIFICIAL TEARS OP OINT
TOPICAL_OINTMENT | OPHTHALMIC | Status: AC
  Filled 2014-10-17: qty 3.5

## 2014-10-17 MED ORDER — SUCCINYLCHOLINE CHLORIDE 20 MG/ML IJ SOLN
INTRAMUSCULAR | Status: AC
  Filled 2014-10-17: qty 1

## 2014-10-17 MED ORDER — EPHEDRINE SULFATE 50 MG/ML IJ SOLN
INTRAMUSCULAR | Status: AC
  Filled 2014-10-17: qty 1

## 2014-10-17 MED ORDER — PHENYLEPHRINE 40 MCG/ML (10ML) SYRINGE FOR IV PUSH (FOR BLOOD PRESSURE SUPPORT)
PREFILLED_SYRINGE | INTRAVENOUS | Status: AC
  Filled 2014-10-17: qty 10

## 2014-10-17 MED ORDER — HYDROMORPHONE HCL 1 MG/ML IJ SOLN
INTRAMUSCULAR | Status: AC
  Filled 2014-10-17: qty 1

## 2014-10-17 MED ORDER — SODIUM CHLORIDE 0.9 % IJ SOLN
3.0000 mL | Freq: Two times a day (BID) | INTRAMUSCULAR | Status: DC
  Administered 2014-10-17 (×2): 3 mL via INTRAVENOUS

## 2014-10-17 MED ORDER — TRAMADOL HCL 50 MG PO TABS: 50.0000 mg | ORAL_TABLET | Freq: Two times a day (BID) | ORAL | Status: DC | PRN

## 2014-10-17 MED ORDER — ALBUTEROL SULFATE HFA 108 (90 BASE) MCG/ACT IN AERS: 2.0000 | INHALATION_SPRAY | Freq: Four times a day (QID) | RESPIRATORY_TRACT | Status: DC | PRN

## 2014-10-17 MED ORDER — ONDANSETRON HCL 4 MG/2ML IJ SOLN
INTRAMUSCULAR | Status: DC | PRN
  Administered 2014-10-17: 4 mg via INTRAVENOUS

## 2014-10-17 MED ORDER — PHENOL 1.4 % MT LIQD: 1.0000 | OROMUCOSAL | Status: DC | PRN

## 2014-10-17 MED ORDER — ONDANSETRON HCL 4 MG/2ML IJ SOLN
INTRAMUSCULAR | Status: AC
  Filled 2014-10-17: qty 2

## 2014-10-17 MED ORDER — 0.9 % SODIUM CHLORIDE (POUR BTL) OPTIME
TOPICAL | Status: DC | PRN
  Administered 2014-10-17: 1000 mL

## 2014-10-17 MED ORDER — BUPIVACAINE HCL (PF) 0.25 % IJ SOLN
INTRAMUSCULAR | Status: DC | PRN
  Administered 2014-10-17: 10 mL

## 2014-10-17 MED ORDER — SENNA 8.6 MG PO TABS
1.0000 | ORAL_TABLET | Freq: Every day | ORAL | Status: DC
  Administered 2014-10-17: 8.6 mg via ORAL
  Filled 2014-10-17 (×2): qty 1

## 2014-10-17 MED ORDER — SODIUM CHLORIDE 0.9 % IV SOLN
10.0000 mg | INTRAVENOUS | Status: DC | PRN
  Administered 2014-10-17: 10 ug/min via INTRAVENOUS

## 2014-10-17 MED ORDER — NEOSTIGMINE METHYLSULFATE 10 MG/10ML IV SOLN
INTRAVENOUS | Status: AC
  Filled 2014-10-17: qty 1

## 2014-10-17 MED ORDER — ONDANSETRON HCL 4 MG/2ML IJ SOLN
4.0000 mg | INTRAMUSCULAR | Status: DC | PRN
Start: 2014-10-17 — End: 2014-10-18
  Administered 2014-10-17 (×2): 4 mg via INTRAVENOUS
  Filled 2014-10-17 (×2): qty 2

## 2014-10-17 MED ORDER — MIDAZOLAM HCL 5 MG/5ML IJ SOLN
INTRAMUSCULAR | Status: DC | PRN
  Administered 2014-10-17: 2 mg via INTRAVENOUS

## 2014-10-17 MED ORDER — PROPOFOL 10 MG/ML IV BOLUS
INTRAVENOUS | Status: AC
  Filled 2014-10-17: qty 20

## 2014-10-17 MED ORDER — CYCLOBENZAPRINE HCL 10 MG PO TABS: 10.0000 mg | ORAL_TABLET | Freq: Three times a day (TID) | ORAL | Status: DC | PRN

## 2014-10-17 SURGICAL SUPPLY — 72 items
ALLOSTEM STRIP 20MMX50MM (Tissue) ×3 IMPLANT
BAG DECANTER FOR FLEXI CONT (MISCELLANEOUS) ×3 IMPLANT
BENZOIN TINCTURE PRP APPL 2/3 (GAUZE/BANDAGES/DRESSINGS) ×3 IMPLANT
BLADE CLIPPER SURG (BLADE) IMPLANT
BLADE SURG 11 STRL SS (BLADE) ×3 IMPLANT
BONE ALLOSTEM MORSELIZED 5CC (Bone Implant) ×3 IMPLANT
BRUSH SCRUB EZ PLAIN DRY (MISCELLANEOUS) ×3 IMPLANT
BUR MATCHSTICK NEURO 3.0 LAGG (BURR) ×3 IMPLANT
BUR PRECISION FLUTE 6.0 (BURR) ×3 IMPLANT
CAGE RISE 11-17-15 10X22 (Cage) ×3 IMPLANT
CAGE RISE 11-17-15 10X26 (Cage) ×3 IMPLANT
CANISTER SUCT 3000ML (MISCELLANEOUS) ×3 IMPLANT
CAP LOCKING THREADED (Cap) ×12 IMPLANT
CLOSURE WOUND 1/2 X4 (GAUZE/BANDAGES/DRESSINGS) ×1
CONT SPEC 4OZ CLIKSEAL STRL BL (MISCELLANEOUS) ×6 IMPLANT
COVER BACK TABLE 60X90IN (DRAPES) ×3 IMPLANT
DECANTER SPIKE VIAL GLASS SM (MISCELLANEOUS) ×3 IMPLANT
DRAPE C-ARM 42X72 X-RAY (DRAPES) ×6 IMPLANT
DRAPE LAPAROTOMY 100X72X124 (DRAPES) ×3 IMPLANT
DRAPE POUCH INSTRU U-SHP 10X18 (DRAPES) ×3 IMPLANT
DRAPE PROXIMA HALF (DRAPES) IMPLANT
DRAPE SURG 17X23 STRL (DRAPES) ×3 IMPLANT
DRSG OPSITE 4X5.5 SM (GAUZE/BANDAGES/DRESSINGS) ×3 IMPLANT
DRSG OPSITE POSTOP 4X8 (GAUZE/BANDAGES/DRESSINGS) ×3 IMPLANT
DURAPREP 26ML APPLICATOR (WOUND CARE) ×3 IMPLANT
ELECT REM PT RETURN 9FT ADLT (ELECTROSURGICAL) ×3
ELECTRODE REM PT RTRN 9FT ADLT (ELECTROSURGICAL) ×1 IMPLANT
EVACUATOR 3/16  PVC DRAIN (DRAIN) ×2
EVACUATOR 3/16 PVC DRAIN (DRAIN) ×1 IMPLANT
GAUZE SPONGE 4X4 12PLY STRL (GAUZE/BANDAGES/DRESSINGS) ×3 IMPLANT
GAUZE SPONGE 4X4 16PLY XRAY LF (GAUZE/BANDAGES/DRESSINGS) IMPLANT
GLOVE BIO SURGEON STRL SZ8 (GLOVE) ×6 IMPLANT
GLOVE BIOGEL PI IND STRL 7.0 (GLOVE) ×2 IMPLANT
GLOVE BIOGEL PI INDICATOR 7.0 (GLOVE) ×4
GLOVE ECLIPSE 7.5 STRL STRAW (GLOVE) IMPLANT
GLOVE EXAM NITRILE LRG STRL (GLOVE) IMPLANT
GLOVE EXAM NITRILE MD LF STRL (GLOVE) IMPLANT
GLOVE EXAM NITRILE XL STR (GLOVE) IMPLANT
GLOVE EXAM NITRILE XS STR PU (GLOVE) IMPLANT
GLOVE INDICATOR 8.5 STRL (GLOVE) ×6 IMPLANT
GLOVE SS BIOGEL STRL SZ 6.5 (GLOVE) ×3 IMPLANT
GLOVE SUPERSENSE BIOGEL SZ 6.5 (GLOVE) ×6
GLOVE SURG SS PI 7.0 STRL IVOR (GLOVE) ×6 IMPLANT
GOWN STRL REUS W/ TWL LRG LVL3 (GOWN DISPOSABLE) ×1 IMPLANT
GOWN STRL REUS W/ TWL XL LVL3 (GOWN DISPOSABLE) ×2 IMPLANT
GOWN STRL REUS W/TWL 2XL LVL3 (GOWN DISPOSABLE) IMPLANT
GOWN STRL REUS W/TWL LRG LVL3 (GOWN DISPOSABLE) ×2
GOWN STRL REUS W/TWL XL LVL3 (GOWN DISPOSABLE) ×4
KIT BASIN OR (CUSTOM PROCEDURE TRAY) ×3 IMPLANT
KIT ROOM TURNOVER OR (KITS) ×3 IMPLANT
LIQUID BAND (GAUZE/BANDAGES/DRESSINGS) ×3 IMPLANT
MILL MEDIUM DISP (BLADE) ×3 IMPLANT
NEEDLE HYPO 25X1 1.5 SAFETY (NEEDLE) ×3 IMPLANT
NS IRRIG 1000ML POUR BTL (IV SOLUTION) ×3 IMPLANT
PACK LAMINECTOMY NEURO (CUSTOM PROCEDURE TRAY) ×3 IMPLANT
PAD ARMBOARD 7.5X6 YLW CONV (MISCELLANEOUS) ×12 IMPLANT
ROD 40MM SPINAL (Rod) ×6 IMPLANT
SCREW 6.5X45 (Screw) ×6 IMPLANT
SCREW POLY THRD CREO 6.5X40 (Screw) ×6 IMPLANT
SPONGE LAP 4X18 X RAY DECT (DISPOSABLE) IMPLANT
SPONGE SURGIFOAM ABS GEL 100 (HEMOSTASIS) ×3 IMPLANT
STRIP CLOSURE SKIN 1/2X4 (GAUZE/BANDAGES/DRESSINGS) ×2 IMPLANT
SUT VIC AB 0 CT1 18XCR BRD8 (SUTURE) ×2 IMPLANT
SUT VIC AB 0 CT1 8-18 (SUTURE) ×4
SUT VIC AB 2-0 CT1 18 (SUTURE) ×3 IMPLANT
SUT VICRYL 4-0 PS2 18IN ABS (SUTURE) ×3 IMPLANT
SYR 20ML ECCENTRIC (SYRINGE) ×3 IMPLANT
TOWEL OR 17X24 6PK STRL BLUE (TOWEL DISPOSABLE) ×3 IMPLANT
TOWEL OR 17X26 10 PK STRL BLUE (TOWEL DISPOSABLE) ×3 IMPLANT
TRAY FOLEY CATH 14FRSI W/METER (CATHETERS) IMPLANT
TRAY FOLEY CATH 16FRSI W/METER (SET/KITS/TRAYS/PACK) ×3 IMPLANT
WATER STERILE IRR 1000ML POUR (IV SOLUTION) ×3 IMPLANT

## 2014-10-17 NOTE — Anesthesia Postprocedure Evaluation (Signed)
  Anesthesia Post-op Note  Patient: Raymond Zuniga  Procedure(s) Performed: Procedure(s) with comments: BILATERAL LUMBAR FOUR-FIVE POSTERIOR LUMBAR FUSION 1 LEVEL (Bilateral) - Bilateral L45 posterior lumbar interbody fusion with interbody prosthesis with posterior lateral arthrodesis and posterior segmental instrumentation  Patient Location: PACU  Anesthesia Type:General  Level of Consciousness: awake and alert   Airway and Oxygen Therapy: Patient Spontanous Breathing  Post-op Pain: mild  Post-op Assessment: Post-op Vital signs reviewed  Post-op Vital Signs: stable  Last Vitals:  Filed Vitals:   10/17/14 1345  BP: 130/68  Pulse: 87  Temp:   Resp: 22    Complications: No apparent anesthesia complications

## 2014-10-17 NOTE — Op Note (Signed)
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  Postoperative diagnosis: Same  Procedure: #1 redo decompressive lumbar laminectomy complete medial facetectomies and radical foraminotomies in excess and requiring more work and will be needed with a standard interbody fusion  #2 posterior lumbar interbody fusion using the globus rise expandable peek cages packed with locally harvested autograft mixed with allostem morsels and strips  #3 pedicle screw fixation L4-5 using the globus Creo pedicle screw system 5.5  #4 posterior lateral arthrodesis L4-5 using locally harvested autograft mixed with allostem  Surgeon: Jillyn Hidden Robie Oats  Asst.: Marikay Alar  Anesthesia: Gen.  EBL: Minimal  History of present illness: Patient is a 49 year old dominant undergone previous decompressive laminotomy at L4-5 was a progress worsening back and bilateral leg pain rating down at L4 and L5 nerve root pattern. Workup revealed severe facet arthropathy bilateral pars defects a slight spondylolisthesis and severe foraminal and lateral recess stenosis of the L4 and L5 nerve roots. Due to patient's failure conservative treatment imaging findings and progression of clinical syndrome I recommended redo decompression stabilization procedure. I extensively reviewed the risks and benefits of the operation with the patient as well as perioperative course expectations of outcome and alternatives surgery and he understood and agreed to proceed forward.  Operative procedure: Patient was brought into the or was induced under general anesthesia positioned prone the Wilson frame his back was prepped and draped in routine sterile fashion. His old incision was opened up and extended cephalad caudally the scar tissues dissected free and subperiosteal dissections care lamina of the residua of L4 the facet complex at L4-5  and the L5 lamina. I did identify the TPs at L4 and L5 confirmed by interoperative x-ray. Then I removed some of the inferior aspect of the L3 spinous process and superior aspect of the L5 spinous process to identify the lamina dural plane. Then working laterally I freed up the scar from the undersurface of the residual laminotomy defect and facet complex and performed medial canal and complete facetectomies aggressive under biting the superior tickling facet a ladder axis to stay lateral margins of disc space and then identified the L5 foramen and unroofed the L5 foramen off the pedicle then marching superiorly identify the inferior aspect of the L4 pedicle and decompress and unroofed the L4 foramen. After the epidural veins granulated enough lateral disc was exposed attention second AO side. No similar fashion the scar tissues freed up completely to facetectomies were performed a radical foraminotomies freeing up scar tissue as I went to the L4 and L5 nerve roots on the left. At this point attention was turned to pedicles placement at L4 and L5 using fluoroscopy pilot holes were drilled cannulated with the awl probed O55 Probed again and 6 5 x 45 screws inserted L4 bilaterally and 6 5 x 40 0 L5 bilaterally all screws excellent purchase. Then the epidural veins were further coagulated disc space was incised bilaterally and placed a 12 distractor on the left side the interspace cleaned out the disc from the right scraped the endplates and used an expandable 08-14-21 millimeters in length cage was packed with the autograft mix. After good position was confirmed the distractor was removed a clean the disc space out from the other side and cleaned all the central disc out and packed the central interspace with a copious amount of autograft mix. Then because I felt like back and put  in a longer cage was selected 10-17 expandable 15 lordosis 22 mm in length cage and opened this up and noticed this significantly reduced  and decompressed and opened up the L4 foramen. After all the interbody work been done and the pedicle screws and placed posterior fluoroscopy confirmed good position of all the implants the wounds and copious irrigated discussing states was maintained aggressive decortication was care MTPs or lateral facet complexes the remainder of the autograft mix was inserted posterior laterally all the foraminal reinspected confirm patency no migration of graft material Gelfoam was overlaid top of the dura 2 rods were placed and tightened down then a large Hemovac drain was placed the wounds closed in layers with interrupted Vicryl and a running 4 subcuticular and Dermabond benzo and Steri-Strips sterile dressing were applied patient recovered in stable condition. At the end the case all needle counts sponge counts were correct.

## 2014-10-17 NOTE — Anesthesia Preprocedure Evaluation (Addendum)
Anesthesia Evaluation  Patient identified by MRN, date of birth, ID band Patient awake    Reviewed: Allergy & Precautions, NPO status , Patient's Chart, lab work & pertinent test results  Airway Mallampati: II  TM Distance: >3 FB Neck ROM: Full    Dental  (+) Edentulous Upper, Edentulous Lower   Pulmonary asthma ,  breath sounds clear to auscultation        Cardiovascular Rhythm:Regular Rate:Normal     Neuro/Psych Bipolar Disorder    GI/Hepatic GERD-  ,  Endo/Other  Morbid obesity  Renal/GU      Musculoskeletal  (+) Arthritis -,   Abdominal   Peds  Hematology   Anesthesia Other Findings   Reproductive/Obstetrics                            Anesthesia Physical Anesthesia Plan  ASA: III  Anesthesia Plan: General   Post-op Pain Management:    Induction: Intravenous  Airway Management Planned: Oral ETT  Additional Equipment:   Intra-op Plan:   Post-operative Plan: Extubation in OR  Informed Consent: I have reviewed the patients History and Physical, chart, labs and discussed the procedure including the risks, benefits and alternatives for the proposed anesthesia with the patient or authorized representative who has indicated his/her understanding and acceptance.   Dental advisory given  Plan Discussed with: CRNA and Surgeon  Anesthesia Plan Comments:         Anesthesia Quick Evaluation
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
## 2014-10-17 NOTE — Transfer of Care (Signed)
Immediate Anesthesia Transfer of Care Note  Patient: Jacolyn ReedyLewis Eichenberger  Procedure(s) Performed: Procedure(s) with comments: BILATERAL LUMBAR FOUR-FIVE POSTERIOR LUMBAR FUSION 1 LEVEL (Bilateral) - Bilateral L45 posterior lumbar interbody fusion with interbody prosthesis with posterior lateral arthrodesis and posterior segmental instrumentation  Patient Location: PACU  Anesthesia Type:General  Level of Consciousness: patient cooperative and responds to stimulation  Airway & Oxygen Therapy: Patient Spontanous Breathing and Patient connected to face mask oxygen  Post-op Assessment: Report given to PACU RN, Post -op Vital signs reviewed and stable and Patient moving all extremities  Post vital signs: Reviewed and stable  Complications: No apparent anesthesia complications

## 2014-10-18 MED ORDER — OXYCODONE-ACETAMINOPHEN 5-325 MG PO TABS: 1.0000 | ORAL_TABLET | ORAL | Status: AC | PRN

## 2014-10-18 NOTE — Progress Notes (Signed)
Patient ID: Raymond Zuniga, male   DOB: December 03, 1965, 49 y.o.   MRN: [id number] Patient doing well pain well controlled no leg pain all incisional pain  Strength 5 out of 5 wound clean dry and intact  Mobilized day with physical and occupational therapy will be prepared to allow the patient to go home today because of the bad weather that is coming if he stable enough her physical therapy and voiding..Marland Kitchen

## 2014-10-18 NOTE — Progress Notes (Signed)
Patient ambulates without staff assistance. Educated about fall precautions and asked to notify staff prior to ambulation. Will continue to monitor closely.

## 2014-10-18 NOTE — Progress Notes (Signed)
Orthopedic Tech Progress Note Patient Details:  Raymond Zuniga Fant 06-23-1966 [id number] Spoke with nurse; nurse stated she personally called [employer] to place patient's back brace order. No further action needed from Ortho at this time. Patient ID: Raymond Zuniga Pulido, male   DOB: 06-23-1966, 49 y.o.   MRN: [id number]   Orie Routsia R Thompson 10/18/2014, 11:43 AM

## 2014-10-18 NOTE — Evaluation (Signed)
Occupational Therapy Evaluation Patient Details Name: Raymond Zuniga MRN: [id number] DOB: [birth year] Today's Date: 10/18/2014    History of Present Illness L4-5 decompression and fusion   Clinical Impression   Patient admitted with above. Patient independent PTA. Patient currently functioning at an overall mod I > intermittent supervision level. D/C from acute OT services and no additional follow-up OT needs at this time. All appropriate education provided to patient and family. Please re-order OT as needed.      Follow Up Recommendations  No OT follow up;Supervision - Intermittent    Equipment Recommendations  None recommended by OT (patient states he has a 3-in-1)    Recommendations for Other Services  None at this time     Precautions / Restrictions Precautions Precautions: Fall;Back Precaution Comments: Educated patient on back precautions and handout provided for patient and wife to review Required Braces or Orthoses:  (no brace available in room, notified RN as patient supposed to have brace) Restrictions Weight Bearing Restrictions: No      Mobility Bed Mobility Overal bed mobility: Modified Independent General bed mobility comments: Therapist reviewed importance of adhereing to back precautions   Transfers Overall transfer level: Modified independent Equipment used: Rolling walker (2 wheeled) General transfer comment: Therapist reviewed importance of adhereing to back precautions and hand placement when using RW, patient did so during sit<>stand transfer    Balance Overall balance assessment: No apparent balance deficits (not formally assessed)     ADL Overall ADL's : Modified independent General ADL Comments: Patient able to cross legs for LB ADLs. Patient safely adheres to back precautions and is able to use RW safely.                Pertinent Vitals/Pain Pain Assessment: 0-10 Pain Score: 2  Pain Location: back Pain Descriptors / Indicators:  Aching Pain Intervention(s): Monitored during session;Repositioned     Hand Dominance Right   Extremity/Trunk Assessment Upper Extremity Assessment Upper Extremity Assessment: Overall WFL for tasks assessed   Lower Extremity Assessment Lower Extremity Assessment: Defer to PT evaluation   Cervical / Trunk Assessment Cervical / Trunk Assessment: Normal   Communication Communication Communication: No difficulties   Cognition Arousal/Alertness: Awake/alert Behavior During Therapy: WFL for tasks assessed/performed Overall Cognitive Status: Within Functional Limits for tasks assessed             Home Living Family/patient expects to be discharged to:: Private residence Living Arrangements: Spouse/significant other;Children (daughter who is 9) Available Help at Discharge: Family;Available 24 hours/day Type of Home: House Home Access: Stairs to enter Entergy CorporationEntrance Stairs-Number of Steps: 4-6 Entrance Stairs-Rails: Can reach both Home Layout: Two level;Able to live on main level with bedroom/bathroom;Bed/bath upstairs (patient reports his bed/bath is upstairs and prefers to use that) Alternate Level Stairs-Number of Steps: flight Alternate Level Stairs-Rails: Can reach both Bathroom Shower/Tub: Tub/shower unit;Curtain   Bathroom Toilet: Standard     Home Equipment: Environmental consultantWalker - 2 wheels;Bedside commode          Prior Functioning/Environment Level of Independence: Independent        Comments: Patient states he was working up until March when he started having back problems    OT Diagnosis: Generalized weakness   OT Problem List:  (n/a, no acute OT needs)   OT Treatment/Interventions:      OT Goals(Current goals can be found in the care plan section) Acute Rehab OT Goals Patient Stated Goal: go home  OT Frequency:     Barriers to D/C:  None known at  this time           End of Session Equipment Utilized During Treatment: Rolling walker Nurse Communication: Mobility  status (and okay for wife to assist with mobility prn)  Activity Tolerance: Patient tolerated treatment well Patient left: in bed;with call bell/phone within reach;with nursing/sitter in room;with family/visitor present   Time: 4098-1191 OT Time Calculation (min): 25 min Charges:  OT General Charges $OT Visit: 1 Procedure OT Evaluation $Initial OT Evaluation Tier I: 1 Procedure OT Treatments $Self Care/Home Management : 8-22 mins  Grissel Tyrell , MS, OTR/L, CLT Pager: [phone]  10/18/2014, 9:58 AM

## 2014-10-18 NOTE — Evaluation (Addendum)
Physical Therapy Evaluation Patient Details Name: Raymond Zuniga MRN: [id number] DOB: 1966/06/06 Today's Date: 10/18/2014   History of Present Illness  L4-5 decompression and fusion  Clinical Impression  Pt presents with above.  Tolerated OOB, gait and stairs at mod I to distant S level (for stairs).  Did not have back brace at time of eval, however ensured and educated on importance of maintaining upright posture/spine with all activity.  Had handout of back precautions and provided brief education on how to don/doff brace if he does have to get one.  Pt will not require any further follow up in hospital or at home.  PT to sign off at this time.      Follow Up Recommendations No PT follow up    Equipment Recommendations  None recommended by PT    Recommendations for Other Services       Precautions / Restrictions Precautions Precautions: Fall;Back Precaution Comments: Educated patient on back precautions and handout provided for patient and wife to review Required Braces or Orthoses:  (no brace available in room, notified RN as patient supposed to have brace) Restrictions Weight Bearing Restrictions: No      Mobility  Bed Mobility Overal bed mobility: Modified Independent             General bed mobility comments: Therapist reviewed importance of adhereing to back precautions   Transfers Overall transfer level: Modified independent Equipment used: Rolling walker (2 wheeled)             General transfer comment: Therapist reviewed importance of adhereing to back precautions and hand placement when using RW, patient did so during sit<>stand transfer  Ambulation/Gait Ambulation/Gait assistance: Modified independent (Device/Increase time) Ambulation Distance (Feet): 500 Feet Assistive device: Rolling walker (2 wheeled) Gait Pattern/deviations: Step-through pattern;WFL(Within Functional Limits) Gait velocity: WFL   General Gait Details: Pt did very well with use of  RW for gait.  Maintained great posture throughout.    Stairs Stairs: Yes Stairs assistance: Supervision Stair Management: Alternating pattern;Step to pattern;Forwards;One rail Right Number of Stairs: 12 General stair comments: Pt able to perform stairs at S to mod I level in step to with intermittent alternating pattern.    Wheelchair Mobility    Modified Rankin (Stroke Patients Only)       Balance Overall balance assessment: No apparent balance deficits (not formally assessed)                                           Pertinent Vitals/Pain Pain Assessment: 0-10 Pain Score: 4  Pain Location: back Pain Descriptors / Indicators: Aching Pain Intervention(s): Repositioned;Monitored during session    Home Living Family/patient expects to be discharged to:: Private residence Living Arrangements: Spouse/significant other;Children Available Help at Discharge: Family;Available 24 hours/day Type of Home: House Home Access: Stairs to enter Entrance Stairs-Rails: Can reach both Entrance Stairs-Number of Steps: 4-6 Home Layout: Two level;Able to live on main level with bedroom/bathroom;Bed/bath upstairs Home Equipment: Walker - 2 wheels;Bedside commode      Prior Function Level of Independence: Independent         Comments: Patient states he was working up until March when he started having back problems     Hand Dominance   Dominant Hand: Right    Extremity/Trunk Assessment   Upper Extremity Assessment: Overall WFL for tasks assessed  Lower Extremity Assessment: Overall WFL for tasks assessed      Cervical / Trunk Assessment: Normal  Communication   Communication: No difficulties  Cognition Arousal/Alertness: Awake/alert Behavior During Therapy: WFL for tasks assessed/performed Overall Cognitive Status: Within Functional Limits for tasks assessed                      General Comments General comments (skin integrity,  edema, etc.): Pt still did not have back brace at time of PT eval, however ensured and educated pt on importance of maintaining upright posture with all mobility.  Also educated briefly on how to don/doff brace if he does have to get one.     Exercises        Assessment/Plan    PT Assessment Patent does not need any further PT services  PT Diagnosis Acute pain   PT Problem List    PT Treatment Interventions     PT Goals (Current goals can be found in the Care Plan section) Acute Rehab PT Goals Patient Stated Goal: go home PT Goal Formulation: All assessment and education complete, DC therapy Potential to Achieve Goals: Good    Frequency     Barriers to discharge        Co-evaluation               End of Session   Activity Tolerance: Patient tolerated treatment well Patient left: in chair;with call bell/phone within reach Nurse Communication: Mobility status         Time: [id number] PT Time Calculation (min) (ACUTE ONLY): 14 min   Charges:     PT Treatments $Gait Training: 8-22 mins   PT G Codes:        Vista Deckarcell, Emmakate Hypes Ann 10/18/2014, 10:56 AM

## 2014-10-18 NOTE — Progress Notes (Signed)
New order for lumbar corsett. [employer] called and order placed

## 2014-10-18 NOTE — Progress Notes (Signed)
Foley removed. Pt tolerated well.

## 2014-10-18 NOTE — Progress Notes (Signed)
Discharge instructions and prescriptions discussed and given to patient.

## 2014-10-18 NOTE — Discharge Instructions (Signed)
No lifting no bending no twisting no driving  Spinal Fusion Care After Refer to this sheet in the next few weeks. These instructions provide you with information on caring for yourself after your procedure. Your caregiver may also give you more specific instructions. Your treatment has been planned according to current medical practices, but problems sometimes occur. Call your caregiver if you have any problems or questions after your procedure. HOME CARE INSTRUCTIONS   Take whatever pain medicine has been prescribed by your caregiver. Do not take over-the-counter pain medicine unless directed otherwise by your caregiver.  Do not drive if you are taking narcotic pain medicines.  Change your bandage (dressing) if necessary or as directed by your caregiver.  Do not get your surgical cut (incision) wet. After a few days you may take quick showers (rather than baths), but keep your incision clean and dry. Covering the incision with plastic wrap while you shower should keep your incision dry. A few weeks after surgery, once your incision has healed and your caregiver says it is okay, you can take baths or go swimming.  If you have been prescribed medicine to prevent your blood from clotting, follow the directions carefully.  Check the area around your incision often. Look for redness and swelling. Also, look for anything leaking from your wound. You can use a mirror or have a family member inspect your incision if it is in a place where it is difficult for you to see.  Ask your caregiver what activities you should avoid and for how long.  Walk as much as possible.  Do not lift anything heavier than 10 pounds (4.5 kilograms) until your caregiver says it is safe.  Do not twist or bend for a few weeks. Try not to pull on things. Avoid sitting for long periods of time. Change positions at least every hour.  Ask your caregiver what kinds of exercise you should do to make your back stronger and when  you should begin doing these exercises. SEEK IMMEDIATE MEDICAL CARE IF:   Pain suddenly becomes much worse.  The incision area is red, swollen, bleeding, or leaking fluid.  Your legs or feet become increasingly painful, numb, weak, or swollen.  You have trouble controlling urination or bowel movements.  You have trouble breathing.  You have chest pain.  You have a fever. MAKE SURE YOU:  Understand these instructions.  Will watch your condition.  Will get help right away if you are not doing well or get worse. Document Released: 04/03/2005 Document Revised: 12/07/2011 Document Reviewed: 11/27/2010 [facility] Patient Information 2015 [place], [place]. This information is not intended to replace advice given to you by your health care provider. Make sure you discuss any questions you have with your health care provider.

## 2014-10-18 NOTE — Clinical Social Work Note (Addendum)
CSW Consult Acknowledged:   CSW received a consult for SNF placement. CSW awaiting PT/OT evaluation to determine the appropriate level of care.       Addendum: CSW reviewed chart. PT/OT is recommending no PT/OT follow up. CSW wil sign off.   Lyan Holck, MSW, LCSWA [phone]

## 2014-10-18 NOTE — Progress Notes (Addendum)
Patient and wife escorted down to discharge lounge via wheelchair with nurse tech.

## 2014-10-19 MED FILL — Heparin Sodium (Porcine) Inj 1000 Unit/ML: INTRAMUSCULAR | Qty: 30 | Status: AC

## 2014-10-19 MED FILL — Sodium Chloride IV Soln 0.9%: INTRAVENOUS | Qty: 1000 | Status: AC

## 2014-12-05 NOTE — Discharge Summary (Signed)
  Physician Discharge Summary  Patient ID: Raymond Zuniga MRN: [id number] DOB/AGE: [birth year] 49 y.o.  Admit date: 10/17/2014 Discharge date: 10/18/2014  Admission Diagnoses: Grade 1 spondylolisthesis L4-5  Discharge Diagnoses: Same Active Problems:   Spinal stenosis of lumbar region   Discharged Condition: good  Hospital Course: Patient is hospital underwent decompression stabilization procedure at L4-5. Postop patient did very well recovered in the floor on the floor was angling and voiding spontaneously stable for discharge home posterior day 1.  Consults: Significant Diagnostic Studies: Treatments: L4-5 decompression and fusion Discharge Exam: Blood pressure 112/57, pulse 87, temperature 98.4 F (36.9 C), temperature source Oral, resp. rate 20, height 5\' 8"  (1.727 m), weight 129.275 kg (285 lb), SpO2 95 %. Strength out of 5 wound clean dry and intact  Disposition: Home     Medication List    TAKE these medications        albuterol 108 (90 BASE) MCG/ACT inhaler  Commonly known as:  PROVENTIL HFA;VENTOLIN HFA  Inhale 2 puffs into the lungs every 6 (six) hours as needed for wheezing or shortness of breath.     amphetamine-dextroamphetamine 20 MG tablet  Commonly known as:  ADDERALL  Take 20 mg by mouth 2 (two) times daily.     buPROPion 300 MG 24 hr tablet  Commonly known as:  WELLBUTRIN XL  Take 300 mg by mouth every morning.     cyclobenzaprine 10 MG tablet  Commonly known as:  FLEXERIL  Take 10 mg by mouth every 6 (six) hours as needed for muscle spasms.     ibuprofen 600 MG tablet  Commonly known as:  ADVIL,MOTRIN  Take 1 tablet (600 mg total) by mouth every 6 (six) hours as needed.     loratadine 10 MG tablet  Commonly known as:  CLARITIN  Take 10 mg by mouth daily as needed for allergies.     meloxicam 15 MG tablet  Commonly known as:  MOBIC  Take 15 mg by mouth daily as needed for pain.     oxyCODONE-acetaminophen 5-325 MG per tablet  Commonly known  as:  PERCOCET/ROXICET  Take 1-2 tablets by mouth every 4 (four) hours as needed for moderate pain.     senna 8.6 MG tablet  Commonly known as:  SENOKOT  Take 1 tablet by mouth at bedtime.     traMADol 50 MG tablet  Commonly known as:  ULTRAM  Take 50-100 mg by mouth every 12 (twelve) hours as needed (pain).         Signed: Deauna Yaw P 12/05/2014, 8:32 AM

## 2014-12-12 ENCOUNTER — Ambulatory Visit (HOSPITAL_COMMUNITY): Payer: Medicaid Other | Attending: Neurosurgery | Admitting: Physical Therapy

## 2014-12-12 DIAGNOSIS — R293 Abnormal posture: Secondary | ICD-10-CM

## 2014-12-12 DIAGNOSIS — M5441 Lumbago with sciatica, right side: Secondary | ICD-10-CM | POA: Diagnosis not present

## 2014-12-12 NOTE — Patient Instructions (Signed)
Isometric Abdominal   Lying on back with knees bent, tighten stomach by pressing elbows down. Hold __3-5__ seconds. Repeat _10___ times per set. Do __1__ sets per session. Do ___10_ sessions per day.  http://orth.exer.us/1086   Copyright  VHI. All rights reserved.  Bridging   Slowly raise buttocks from floor, keeping stomach tight. Repeat _10___ times per set. Do __1__ sets per session. Do ___2_ sessions per day.  http://orth.exer.us/1096   Copyright  VHI. All rights reserved.  Bent Leg Lift (Hook-Lying)   Tighten stomach and slowly raise right leg __3_ inches from floor. Keep trunk rigid. Hold _3-5___ seconds. Repeat _10___ times per set. Do __1__ sets per session. Do __3__ sessions per day.  http://orth.exer.us/1090   Copyright  VHI. All rights reserved.  Hamstring Stretch: Active   Support behind right knee. Starting with knee bent, attempt to straighten knee until a comfortable stretch is felt in back of thigh. Hold __30__ seconds. Repeat __3__ times per set. Do _1___ sets per session. Do ___2_ sessions per day.  http://orth.exer.us/158   Copyright  VHI. All rights reserved.  Knee-to-Chest Stretch: Unilateral   With hand behind right knee, pull knee in to chest until a comfortable stretch is felt in lower back and buttocks. Keep back relaxed. Hold __30__ seconds. Repeat __10__ times per set. Do ___1_ sets per session. Do __2__ sessions per day.  http://orth.exer.us/126   Copyright  VHI. All rights reserved.

## 2014-12-12 NOTE — Addendum Note (Signed)
Addended by: Bella KennedyUSSELL, Brendalyn Vallely J on: 12/12/2014 12:03 PM   Modules accepted: Orders
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
## 2014-12-25 ENCOUNTER — Ambulatory Visit (HOSPITAL_COMMUNITY): Payer: Medicaid Other | Admitting: Physical Therapy

## 2014-12-25 DIAGNOSIS — R293 Abnormal posture: Secondary | ICD-10-CM | POA: Diagnosis not present

## 2014-12-25 DIAGNOSIS — M5441 Lumbago with sciatica, right side: Secondary | ICD-10-CM
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
## 2014-12-26 ENCOUNTER — Ambulatory Visit (HOSPITAL_COMMUNITY): Payer: Medicaid Other | Admitting: Physical Therapy

## 2014-12-26 DIAGNOSIS — R293 Abnormal posture: Secondary | ICD-10-CM

## 2014-12-26 DIAGNOSIS — M5441 Lumbago with sciatica, right side: Secondary | ICD-10-CM
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
## 2014-12-26 NOTE — Patient Instructions (Signed)
Upper / Lower Extremity Extension (All-Fours)   Tighten stomach and raise right leg and opposite arm. Keep trunk rigid. Repeat _10___ times per set. Do ____1 sets per session. Do ____2 sessions per day.  http://orth.exer.us/1118   Copyright  VHI. All rights reserved.  Hip Extension (All-Fours)   Lift right leg back with knee slightly flexed. Do not arch neck or back. Repeat __10__ times per set. Do __1__ sets per session. Do __2__ sessions per day.  http://orth.exer.us/106   Copyright  VHI. All rights reserved.  Strengthening: Wall Push-Up   With arms slightly wider apart than shoulder width, and feet __12__ inches from wall, gently lean body toward wall. Repeat _10___ times per set. Do ____ sets per session. Do _2___ sessions per day.  http://orth.exer.us/904   Copyright  VHI. All rights reserved.  Functional Quadriceps: Chair Squat   Keeping feet flat on floor, shoulder width apart, squat as low as is comfortable. Use support as necessary. Repeat ___10_ times per set. Do _1___ sets per session. Do _2___ sessions per day.  http://orth.exer.us/736   Copyright  VHI. All rights reserved.

## 2015-01-02 ENCOUNTER — Ambulatory Visit (HOSPITAL_COMMUNITY): Payer: Medicaid Other | Attending: Neurosurgery | Admitting: Physical Therapy

## 2015-01-02 DIAGNOSIS — R293 Abnormal posture: Secondary | ICD-10-CM | POA: Insufficient documentation

## 2015-01-02 DIAGNOSIS — M5441 Lumbago with sciatica, right side: Secondary | ICD-10-CM | POA: Insufficient documentation

## 2015-03-11 ENCOUNTER — Emergency Department (HOSPITAL_COMMUNITY)
Admission: EM | Admit: 2015-03-11 | Discharge: 2015-03-11 | Disposition: A | Discharge status: Home / Self Care | Payer: Medicaid Other | Attending: Emergency Medicine | Admitting: Emergency Medicine

## 2015-03-11 ENCOUNTER — Other Ambulatory Visit: Payer: Self-pay | Admitting: Neurosurgery

## 2015-03-11 ENCOUNTER — Encounter (HOSPITAL_COMMUNITY): Payer: Self-pay | Admitting: Family Medicine

## 2015-03-11 DIAGNOSIS — Z79899 Other long term (current) drug therapy: Secondary | ICD-10-CM | POA: Insufficient documentation

## 2015-03-11 DIAGNOSIS — J45909 Unspecified asthma, uncomplicated: Secondary | ICD-10-CM | POA: Diagnosis not present

## 2015-03-11 DIAGNOSIS — Z8701 Personal history of pneumonia (recurrent): Secondary | ICD-10-CM | POA: Diagnosis not present

## 2015-03-11 DIAGNOSIS — Z8719 Personal history of other diseases of the digestive system: Secondary | ICD-10-CM | POA: Diagnosis not present

## 2015-03-11 DIAGNOSIS — M1990 Unspecified osteoarthritis, unspecified site: Secondary | ICD-10-CM | POA: Diagnosis not present

## 2015-03-11 DIAGNOSIS — F909 Attention-deficit hyperactivity disorder, unspecified type: Secondary | ICD-10-CM | POA: Insufficient documentation

## 2015-03-11 DIAGNOSIS — M5023 Other cervical disc displacement, cervicothoracic region: Secondary | ICD-10-CM

## 2015-03-11 DIAGNOSIS — Z87442 Personal history of urinary calculi: Secondary | ICD-10-CM | POA: Diagnosis not present

## 2015-03-11 DIAGNOSIS — M542 Cervicalgia: Secondary | ICD-10-CM | POA: Diagnosis present

## 2015-03-11 DIAGNOSIS — M546 Pain in thoracic spine: Secondary | ICD-10-CM | POA: Diagnosis not present
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
## 2015-03-11 NOTE — Discharge Instructions (Signed)
Back Pain, Adult °Low back pain is very common. About 1 in 5 people have back pain. The cause of low back pain is rarely dangerous. The pain often gets better over time. About half of people with a sudden onset of back pain feel better in just 2 weeks. About 8 in 10 people feel better by 6 weeks.  °CAUSES °Some common causes of back pain include: °· Strain of the muscles or ligaments supporting the spine. °· Wear and tear (degeneration) of the spinal discs. °· Arthritis. °· Direct injury to the back. °DIAGNOSIS °Most of the time, the direct cause of low back pain is not known. However, back pain can be treated effectively even when the exact cause of the pain is unknown. Answering your caregiver's questions about your overall health and symptoms is one of the most accurate ways to make sure the cause of your pain is not dangerous. If your caregiver needs more information, he or she may order lab work or imaging tests (X-rays or MRIs). However, even if imaging tests show changes in your back, this usually does not require surgery. °HOME CARE INSTRUCTIONS °For many people, back pain returns. Since low back pain is rarely dangerous, it is often a condition that people can learn to manage on their own.  °· Remain active. It is stressful on the back to sit or stand in one place. Do not sit, drive, or stand in one place for more than 30 minutes at a time. Take short walks on level surfaces as soon as pain allows. Try to increase the length of time you walk each day. °· Do not stay in bed. Resting more than 1 or 2 days can delay your recovery. °· Do not avoid exercise or work. Your body is made to move. It is not dangerous to be active, even though your back may hurt. Your back will likely heal faster if you return to being active before your pain is gone. °· Pay attention to your body when you  bend and lift. Many people have less discomfort when lifting if they bend their knees, keep the load close to their bodies, and  avoid twisting. Often, the most comfortable positions are those that put less stress on your recovering back. °· Find a comfortable position to sleep. Use a firm mattress and lie on your side with your knees slightly bent. If you lie on your back, put a pillow under your knees. °· Only take over-the-counter or prescription medicines as directed by your caregiver. Over-the-counter medicines to reduce pain and inflammation are often the most helpful. Your caregiver may prescribe muscle relaxant drugs. These medicines help dull your pain so you can more quickly return to your normal activities and healthy exercise. °· Put ice on the injured area. °¨ Put ice in a plastic bag. °¨ Place a towel between your skin and the bag. °¨ Leave the ice on for 15-20 minutes, 03-04 times a day for the first 2 to 3 days. After that, ice and heat may be alternated to reduce pain and spasms. °· Ask your caregiver about trying back exercises and gentle massage. This may be of some benefit. °· Avoid feeling anxious or stressed. Stress increases muscle tension and can worsen back pain. It is important to recognize when you are anxious or stressed and learn ways to manage it. Exercise is a great option. °SEEK MEDICAL CARE IF: °· You have pain that is not relieved with rest or medicine. °· You have pain that does not improve in 1 week. °· You have new symptoms. °· You are generally not feeling well. °SEEK   IMMEDIATE MEDICAL CARE IF:  °· You have pain that radiates from your back into your legs. °· You develop new bowel or bladder control problems. °· You have unusual weakness or numbness in your arms or legs. °· You develop nausea or vomiting. °· You develop abdominal pain. °· You feel faint. °Document Released: 09/14/2005 Document Revised: 03/15/2012 Document Reviewed: 01/16/2014 °Exit[employer]® Patient Information ©2015 [employer]. This information is not intended to replace advice given to you by your health care provider. Make sure you  discuss any questions you have with your health care provider. ° °Back Exercises °Back exercises help treat and prevent back injuries. The goal of back exercises is to increase the strength of your abdominal and back muscles and the flexibility of your back. These exercises should be started when you no longer have back pain. Back exercises include: °· Pelvic Tilt. Lie on your back with your knees bent. Tilt your pelvis until the lower part of your back is against the floor. Hold this position 5 to 10 sec and repeat 5 to 10 times. °· Knee to Chest. Pull first 1 knee up against your chest and hold for 20 to 30 seconds, repeat this with the other knee, and then both knees. This may be done with the other leg straight or bent, whichever feels better. °· Sit-Ups or Curl-Ups. Bend your knees 90 degrees. Start with tilting your pelvis, and do a partial, slow sit-up, lifting your trunk only 30 to 45 degrees off the floor. Take at least 2 to 3 seconds for each sit-up. Do not do sit-ups with your knees out straight. If partial sit-ups are difficult, simply do the above but with only tightening your abdominal muscles and holding it as directed. °· Hip-Lift. Lie on your back with your knees flexed 90 degrees. Push down with your feet and shoulders as you raise your hips a couple inches off the floor; hold for 10 seconds, repeat 5 to 10 times. °· Back arches. Lie on your stomach, propping yourself up on bent elbows. Slowly press on your hands, causing an arch in your low back. Repeat 3 to 5 times. Any initial stiffness and discomfort should lessen with repetition over time. °· Shoulder-Lifts. Lie face down with arms beside your body. Keep hips and torso pressed to floor as you slowly lift your head and shoulders off the floor. °Do not overdo your exercises, especially in the beginning. Exercises may cause you some mild back discomfort which lasts for a few minutes; however, if the pain is more severe, or lasts for more than 15  minutes, do not continue exercises until you see your caregiver. Improvement with exercise therapy for back problems is slow.  °See your caregivers for assistance with developing a proper back exercise program. °Document Released: 10/22/2004 Document Revised: 12/07/2011 Document Reviewed: 07/16/2011 °ExitCare® Patient Information ©2015 [employer]. This information is not intended to replace advice given to you by your health care provider. Make sure you discuss any questions you have with your health care provider. ° °

## 2015-03-11 NOTE — ED Notes (Signed)
Pt here for neck/upper back pain x 3 months. sts pain radiates into both hands, mostly right hand to finger tips. sts pins and needles.

## 2015-03-11 NOTE — ED Notes (Signed)
Pt verbalizes understanding of d/c instructions and denies any further needs at this time. 

## 2015-03-26 ENCOUNTER — Ambulatory Visit
Admission: RE | Admit: 2015-03-26 | Discharge: 2015-03-26 | Disposition: A | Discharge status: Home / Self Care | Payer: Medicaid Other | Source: Ambulatory Visit | Attending: Neurosurgery | Admitting: Neurosurgery

## 2015-03-26 DIAGNOSIS — M5023 Other cervical disc displacement, cervicothoracic region: Secondary | ICD-10-CM

## 2015-03-27 NOTE — Therapy (Signed)
[place], [place], [address] Phone: [phone]   Fax:  [phone]  Patient Details  Name: Camp Gopal MRN: [id number] Date of Birth: [birth year] Referring Provider:  Kary Kos, MD  Encounter Date: 12/26/2014  PHYSICAL THERAPY DISCHARGE SUMMARY  Visits from Start of Care:3  Current functional level related to goals / functional outcomes: Pt is more aware of his posture    Remaining deficits: Unknown pt did not return for last visit   Education / Equipment: HEP  Plan: Patient agrees to discharge.  Patient goals were partially met. Patient is being discharged due to not returning since the last visit.  ?????       Rayetta Humphrey, PT CLT [phone] 03/27/2015, 11:03 AM  [place], [place], [address] Phone: [phone]   Fax:  [phone]

## 2015-04-04 ENCOUNTER — Other Ambulatory Visit: Payer: Self-pay | Admitting: Neurosurgery

## 2015-04-12 ENCOUNTER — Encounter (HOSPITAL_COMMUNITY)
Admission: RE | Admit: 2015-04-12 | Discharge: 2015-04-12 | Disposition: A | Discharge status: Home / Self Care | Payer: Medicaid Other | Source: Ambulatory Visit | Attending: Neurosurgery | Admitting: Neurosurgery

## 2015-04-12 ENCOUNTER — Encounter (HOSPITAL_COMMUNITY): Payer: Self-pay

## 2015-04-12 DIAGNOSIS — Z01818 Encounter for other preprocedural examination: Secondary | ICD-10-CM | POA: Diagnosis not present

## 2015-04-12 DIAGNOSIS — M5412 Radiculopathy, cervical region: Secondary | ICD-10-CM | POA: Insufficient documentation

## 2015-04-12 HISTORY — DX: Unspecified intracranial injury with loss of consciousness of unspecified duration, initial encounter: S06.9X9A

## 2015-04-12 HISTORY — DX: Unspecified intracranial injury with loss of consciousness of 30 minutes or less, initial encounter: S06.9X1A

## 2015-04-12 HISTORY — DX: Personal history of urinary calculi: Z87.442

## 2015-04-12 HISTORY — DX: Family history of other specified conditions: Z84.89

## 2015-04-12 HISTORY — DX: Personal history of other medical treatment: Z92.89

## 2015-04-12 HISTORY — DX: Reserved for inherently not codable concepts without codable children: IMO0001

## 2015-04-12 LAB — BASIC METABOLIC PANEL
Anion gap: 9 (ref 5–15)
BUN: 11 mg/dL (ref 6–20)
CALCIUM: 9.2 mg/dL (ref 8.9–10.3)
CHLORIDE: 104 mmol/L (ref 101–111)
CO2: 27 mmol/L (ref 22–32)
CREATININE: 1.02 mg/dL (ref 0.61–1.24)
Glucose, Bld: 106 mg/dL — ABNORMAL HIGH (ref 65–99)
Potassium: 4.2 mmol/L (ref 3.5–5.1)
SODIUM: 140 mmol/L (ref 135–145)

## 2015-04-12 LAB — CBC
HCT: 41.7 % (ref 39.0–52.0)
HEMOGLOBIN: 13.4 g/dL (ref 13.0–17.0)
MCH: 28.8 pg (ref 26.0–34.0)
MCHC: 32.1 g/dL (ref 30.0–36.0)
MCV: 89.7 fL (ref 78.0–100.0)
Platelets: 230 10*3/uL (ref 150–400)
RBC: 4.65 MIL/uL (ref 4.22–5.81)
RDW: 14.7 % (ref 11.5–15.5)
WBC: 10.7 10*3/uL — AB (ref 4.0–10.5)

## 2015-04-12 LAB — SURGICAL PCR SCREEN
MRSA, PCR: NEGATIVE
Staphylococcus aureus: NEGATIVE

## 2015-04-12 NOTE — Pre-Procedure Instructions (Signed)
    Raymond Zuniga  04/12/2015      Your procedure is scheduled on Wednesday, July 20th.  [facility] Admitting at 9:40 AM              Call this number if you have problems the morning of surgery [phone]   Remember:  Do not eat food or drink liquids after midnight Tuesday, July 19.  Take these medicines the morning of surgery with A SIP OF WATER :buPROPion [facility](WELLBUTRIN).              Take if needed: oxyCODONE-acetaminophen (PERCOCET/ROXICET) or traMADol (ULTRAM).               Stop taking : meloxicam (MOBIC),  ibuprofen (ADVIL,MOTRIN).   Do not wear jewelry, make-up or nail polish.  Do not wear lotions, powders, or perfumes.               Men may shave face and neck.  Do not bring valuables to the hospital.  [facility] is not responsible for any belongings or valuables.  Contacts, dentures or bridgework may not be worn into surgery.  Leave your suitcase in the car.  After surgery it may be brought to your room.  For patients admitted to the hospital, discharge time will be determined by your treatment team.  Patients discharged the day of surgery will not be allowed to drive home.   Name and phone number of your driver:   -  Special instructions:  For any other questions, please call [phone], Monday - Friday 8 AM - 4 PM.  Please read over the following fact sheets that you were given. Pain Booklet, Coughing and Deep Breathing and Surgical Site Infection Prevention

## 2015-04-16 MED ORDER — CEFAZOLIN SODIUM-DEXTROSE 2-3 GM-% IV SOLR
2.0000 g | INTRAVENOUS | Status: AC
  Administered 2015-04-17: 2 g via INTRAVENOUS
  Filled 2015-04-16: qty 50

## 2015-04-17 ENCOUNTER — Encounter (HOSPITAL_COMMUNITY): Payer: Self-pay | Admitting: Anesthesiology

## 2015-04-17 ENCOUNTER — Ambulatory Visit (HOSPITAL_COMMUNITY): Payer: Medicaid Other

## 2015-04-17 ENCOUNTER — Ambulatory Visit (HOSPITAL_COMMUNITY): Payer: Medicaid Other | Admitting: Anesthesiology

## 2015-04-17 ENCOUNTER — Encounter (HOSPITAL_COMMUNITY)
Admission: RE | Disposition: A | Discharge status: Home / Self Care | Payer: Self-pay | Source: Ambulatory Visit | Attending: Neurosurgery

## 2015-04-17 ENCOUNTER — Inpatient Hospital Stay (HOSPITAL_COMMUNITY)
Admission: RE | Admit: 2015-04-17 | Discharge: 2015-04-18 | DRG: 473 | Disposition: A | Discharge status: Home / Self Care | Payer: Medicaid Other | Source: Ambulatory Visit | Attending: Neurosurgery | Admitting: Neurosurgery

## 2015-04-17 DIAGNOSIS — K219 Gastro-esophageal reflux disease without esophagitis: Secondary | ICD-10-CM | POA: Diagnosis present

## 2015-04-17 DIAGNOSIS — F909 Attention-deficit hyperactivity disorder, unspecified type: Secondary | ICD-10-CM | POA: Diagnosis present

## 2015-04-17 DIAGNOSIS — Z87891 Personal history of nicotine dependence: Secondary | ICD-10-CM

## 2015-04-17 DIAGNOSIS — F410 Panic disorder [episodic paroxysmal anxiety] without agoraphobia: Secondary | ICD-10-CM | POA: Diagnosis present

## 2015-04-17 DIAGNOSIS — M4722 Other spondylosis with radiculopathy, cervical region: Secondary | ICD-10-CM | POA: Diagnosis present

## 2015-04-17 DIAGNOSIS — F319 Bipolar disorder, unspecified: Secondary | ICD-10-CM | POA: Diagnosis present

## 2015-04-17 DIAGNOSIS — M542 Cervicalgia: Secondary | ICD-10-CM | POA: Diagnosis present

## 2015-04-17 DIAGNOSIS — M4802 Spinal stenosis, cervical region: Secondary | ICD-10-CM | POA: Diagnosis present

## 2015-04-17 DIAGNOSIS — J45909 Unspecified asthma, uncomplicated: Secondary | ICD-10-CM | POA: Diagnosis present

## 2015-04-17 DIAGNOSIS — Z79899 Other long term (current) drug therapy: Secondary | ICD-10-CM

## 2015-04-17 DIAGNOSIS — Z419 Encounter for procedure for purposes other than remedying health state, unspecified: Secondary | ICD-10-CM

## 2015-04-17 HISTORY — PX: ANTERIOR CERVICAL DECOMP/DISCECTOMY FUSION: SHX1161

## 2015-04-17 SURGERY — ANTERIOR CERVICAL DECOMPRESSION/DISCECTOMY FUSION 2 LEVELS
Anesthesia: General

## 2015-04-17 MED ORDER — DEXAMETHASONE SODIUM PHOSPHATE 10 MG/ML IJ SOLN
INTRAMUSCULAR | Status: AC
  Filled 2015-04-17: qty 1

## 2015-04-17 MED ORDER — HEMOSTATIC AGENTS (NO CHARGE) OPTIME
TOPICAL | Status: DC | PRN
  Administered 2015-04-17: 1 via TOPICAL

## 2015-04-17 MED ORDER — SODIUM CHLORIDE 0.9 % IR SOLN
Status: DC | PRN
  Administered 2015-04-17: 11:00:00

## 2015-04-17 MED ORDER — SODIUM CHLORIDE 0.9 % IJ SOLN
INTRAMUSCULAR | Status: AC
  Filled 2015-04-17: qty 10

## 2015-04-17 MED ORDER — SUCCINYLCHOLINE CHLORIDE 20 MG/ML IJ SOLN
INTRAMUSCULAR | Status: AC
  Filled 2015-04-17: qty 1

## 2015-04-17 MED ORDER — PROPOFOL 10 MG/ML IV BOLUS
INTRAVENOUS | Status: AC
  Filled 2015-04-17: qty 20

## 2015-04-17 MED ORDER — CYCLOBENZAPRINE HCL 10 MG PO TABS: 10.0000 mg | ORAL_TABLET | Freq: Three times a day (TID) | ORAL | Status: DC | PRN

## 2015-04-17 MED ORDER — SENNOSIDES-DOCUSATE SODIUM 8.6-50 MG PO TABS
1.0000 | ORAL_TABLET | Freq: Every evening | ORAL | Status: DC | PRN
  Filled 2015-04-17: qty 1

## 2015-04-17 MED ORDER — MIDAZOLAM HCL 5 MG/5ML IJ SOLN
INTRAMUSCULAR | Status: DC | PRN
  Administered 2015-04-17: 2 mg via INTRAVENOUS

## 2015-04-17 MED ORDER — ACETAMINOPHEN 650 MG RE SUPP: 650.0000 mg | RECTAL | Status: DC | PRN

## 2015-04-17 MED ORDER — EPHEDRINE SULFATE 50 MG/ML IJ SOLN
INTRAMUSCULAR | Status: AC
  Filled 2015-04-17: qty 1

## 2015-04-17 MED ORDER — ONDANSETRON HCL 4 MG/2ML IJ SOLN
INTRAMUSCULAR | Status: DC | PRN
  Administered 2015-04-17: 4 mg via INTRAVENOUS

## 2015-04-17 MED ORDER — AMPHETAMINE-DEXTROAMPHETAMINE 10 MG PO TABS: 20.0000 mg | ORAL_TABLET | Freq: Two times a day (BID) | ORAL | Status: DC

## 2015-04-17 MED ORDER — PROPOFOL 10 MG/ML IV BOLUS
INTRAVENOUS | Status: DC | PRN
  Administered 2015-04-17: 200 mg via INTRAVENOUS

## 2015-04-17 MED ORDER — THROMBIN 5000 UNITS EX SOLR
OROMUCOSAL | Status: DC | PRN
  Administered 2015-04-17: 10:00:00 via TOPICAL

## 2015-04-17 MED ORDER — HYDROMORPHONE HCL 1 MG/ML IJ SOLN
INTRAMUSCULAR | Status: AC
  Filled 2015-04-17: qty 1

## 2015-04-17 MED ORDER — DEXTROSE 5 % IV SOLN
10.0000 mg | INTRAVENOUS | Status: DC | PRN
  Administered 2015-04-17: 50 ug/min via INTRAVENOUS

## 2015-04-17 MED ORDER — GLYCOPYRROLATE 0.2 MG/ML IJ SOLN
INTRAMUSCULAR | Status: AC
  Filled 2015-04-17: qty 2

## 2015-04-17 MED ORDER — ONDANSETRON HCL 4 MG/2ML IJ SOLN
INTRAMUSCULAR | Status: AC
  Filled 2015-04-17: qty 2

## 2015-04-17 MED ORDER — LACTATED RINGERS IV SOLN
INTRAVENOUS | Status: DC
  Administered 2015-04-17: 10:00:00 via INTRAVENOUS

## 2015-04-17 MED ORDER — ACETAMINOPHEN 325 MG PO TABS: 650.0000 mg | ORAL_TABLET | ORAL | Status: DC | PRN

## 2015-04-17 MED ORDER — PHENYLEPHRINE 40 MCG/ML (10ML) SYRINGE FOR IV PUSH (FOR BLOOD PRESSURE SUPPORT)
PREFILLED_SYRINGE | INTRAVENOUS | Status: AC
  Filled 2015-04-17: qty 10

## 2015-04-17 MED ORDER — FENTANYL CITRATE (PF) 250 MCG/5ML IJ SOLN
INTRAMUSCULAR | Status: AC
  Filled 2015-04-17: qty 5

## 2015-04-17 MED ORDER — LIDOCAINE HCL (CARDIAC) 20 MG/ML IV SOLN
INTRAVENOUS | Status: AC
  Filled 2015-04-17: qty 5

## 2015-04-17 MED ORDER — GLYCOPYRROLATE 0.2 MG/ML IJ SOLN
INTRAMUSCULAR | Status: DC | PRN
  Administered 2015-04-17 (×2): 0.4 mg via INTRAVENOUS

## 2015-04-17 MED ORDER — ONDANSETRON HCL 4 MG/2ML IJ SOLN: 4.0000 mg | Freq: Once | INTRAMUSCULAR | Status: DC | PRN

## 2015-04-17 MED ORDER — HYDROMORPHONE HCL 1 MG/ML IJ SOLN: 0.5000 mg | INTRAMUSCULAR | Status: DC | PRN

## 2015-04-17 MED ORDER — ROCURONIUM BROMIDE 50 MG/5ML IV SOLN
INTRAVENOUS | Status: AC
  Filled 2015-04-17: qty 1

## 2015-04-17 MED ORDER — NEOSTIGMINE METHYLSULFATE 10 MG/10ML IV SOLN
INTRAVENOUS | Status: AC
  Filled 2015-04-17: qty 1

## 2015-04-17 MED ORDER — FENTANYL CITRATE (PF) 100 MCG/2ML IJ SOLN
INTRAMUSCULAR | Status: DC | PRN
  Administered 2015-04-17: 50 ug via INTRAVENOUS
  Administered 2015-04-17 (×3): 100 ug via INTRAVENOUS
  Administered 2015-04-17: 50 ug via INTRAVENOUS

## 2015-04-17 MED ORDER — THROMBIN 5000 UNITS EX SOLR
CUTANEOUS | Status: DC | PRN
  Administered 2015-04-17 (×2): 5000 [IU] via TOPICAL

## 2015-04-17 MED ORDER — MIDAZOLAM HCL 2 MG/2ML IJ SOLN
INTRAMUSCULAR | Status: AC
  Filled 2015-04-17: qty 2

## 2015-04-17 MED ORDER — ALBUTEROL SULFATE (2.5 MG/3ML) 0.083% IN NEBU: 3.0000 mL | INHALATION_SOLUTION | Freq: Four times a day (QID) | RESPIRATORY_TRACT | Status: DC | PRN

## 2015-04-17 MED ORDER — ROCURONIUM BROMIDE 100 MG/10ML IV SOLN
INTRAVENOUS | Status: DC | PRN
  Administered 2015-04-17: 20 mg via INTRAVENOUS
  Administered 2015-04-17: 30 mg via INTRAVENOUS

## 2015-04-17 MED ORDER — CEFAZOLIN SODIUM 1-5 GM-% IV SOLN
1.0000 g | Freq: Three times a day (TID) | INTRAVENOUS | Status: AC
  Administered 2015-04-17 – 2015-04-18 (×2): 1 g via INTRAVENOUS
  Filled 2015-04-17 (×2): qty 50

## 2015-04-17 MED ORDER — ONDANSETRON HCL 4 MG/2ML IJ SOLN
4.0000 mg | INTRAMUSCULAR | Status: DC | PRN
  Administered 2015-04-17 – 2015-04-18 (×3): 4 mg via INTRAVENOUS
  Filled 2015-04-17 (×2): qty 2

## 2015-04-17 MED ORDER — PHENYLEPHRINE HCL 10 MG/ML IJ SOLN
INTRAMUSCULAR | Status: DC | PRN
  Administered 2015-04-17 (×4): 80 ug via INTRAVENOUS

## 2015-04-17 MED ORDER — SODIUM CHLORIDE 0.9 % IJ SOLN: 3.0000 mL | INTRAMUSCULAR | Status: DC | PRN

## 2015-04-17 MED ORDER — SUCCINYLCHOLINE CHLORIDE 20 MG/ML IJ SOLN
INTRAMUSCULAR | Status: DC | PRN
  Administered 2015-04-17: 120 mg via INTRAVENOUS

## 2015-04-17 MED ORDER — DEXAMETHASONE SODIUM PHOSPHATE 10 MG/ML IJ SOLN
INTRAMUSCULAR | Status: DC | PRN
  Administered 2015-04-17: 10 mg via INTRAVENOUS

## 2015-04-17 MED ORDER — 0.9 % SODIUM CHLORIDE (POUR BTL) OPTIME
TOPICAL | Status: DC | PRN
  Administered 2015-04-17: 1000 mL

## 2015-04-17 MED ORDER — SODIUM CHLORIDE 0.9 % IV SOLN
250.0000 mL | INTRAVENOUS | Status: DC
  Administered 2015-04-18: 250 mL via INTRAVENOUS

## 2015-04-17 MED ORDER — OXYCODONE-ACETAMINOPHEN 5-325 MG PO TABS: 1.0000 | ORAL_TABLET | ORAL | Status: DC | PRN

## 2015-04-17 MED ORDER — BUPROPION HCL ER (XL) 300 MG PO TB24
300.0000 mg | ORAL_TABLET | Freq: Every morning | ORAL | Status: DC
  Filled 2015-04-17: qty 1

## 2015-04-17 MED ORDER — NEOSTIGMINE METHYLSULFATE 10 MG/10ML IV SOLN
INTRAVENOUS | Status: DC | PRN
  Administered 2015-04-17: 3 mg via INTRAVENOUS

## 2015-04-17 MED ORDER — TRAMADOL HCL 50 MG PO TABS
50.0000 mg | ORAL_TABLET | Freq: Two times a day (BID) | ORAL | Status: DC | PRN
Start: 2015-04-17 — End: 2015-04-18
  Administered 2015-04-18: 100 mg via ORAL
  Filled 2015-04-17: qty 2

## 2015-04-17 MED ORDER — LIDOCAINE HCL 4 % MT SOLN
OROMUCOSAL | Status: DC | PRN
  Administered 2015-04-17: 4 mL via TOPICAL

## 2015-04-17 MED ORDER — OXYCODONE-ACETAMINOPHEN 5-325 MG PO TABS
1.0000 | ORAL_TABLET | ORAL | Status: DC | PRN
  Administered 2015-04-17: 2 via ORAL
  Filled 2015-04-17: qty 2

## 2015-04-17 MED ORDER — EPHEDRINE SULFATE 50 MG/ML IJ SOLN
INTRAMUSCULAR | Status: DC | PRN
  Administered 2015-04-17: 10 mg via INTRAVENOUS

## 2015-04-17 MED ORDER — SODIUM CHLORIDE 0.9 % IJ SOLN
3.0000 mL | Freq: Two times a day (BID) | INTRAMUSCULAR | Status: DC
  Administered 2015-04-17: 3 mL via INTRAVENOUS

## 2015-04-17 MED ORDER — MENTHOL 3 MG MT LOZG: 1.0000 | LOZENGE | OROMUCOSAL | Status: DC | PRN

## 2015-04-17 MED ORDER — HYDROMORPHONE HCL 1 MG/ML IJ SOLN
0.5000 mg | INTRAMUSCULAR | Status: DC | PRN
  Administered 2015-04-17 (×2): 0.5 mg via INTRAVENOUS

## 2015-04-17 MED ORDER — LIDOCAINE HCL (CARDIAC) 20 MG/ML IV SOLN
INTRAVENOUS | Status: DC | PRN
  Administered 2015-04-17: 80 mg via INTRAVENOUS

## 2015-04-17 MED ORDER — LACTATED RINGERS IV SOLN
INTRAVENOUS | Status: DC | PRN
  Administered 2015-04-17 (×2): via INTRAVENOUS

## 2015-04-17 MED ORDER — PHENOL 1.4 % MT LIQD: 1.0000 | OROMUCOSAL | Status: DC | PRN

## 2015-04-17 SURGICAL SUPPLY — 60 items
BAG DECANTER FOR FLEXI CONT (MISCELLANEOUS) ×3 IMPLANT
BENZOIN TINCTURE PRP APPL 2/3 (GAUZE/BANDAGES/DRESSINGS) ×3 IMPLANT
BIT DRILL SM SPINE QC 14 (BIT) ×3 IMPLANT
BRUSH SCRUB EZ PLAIN DRY (MISCELLANEOUS) ×3 IMPLANT
BUR MATCHSTICK NEURO 3.0 LAGG (BURR) ×3 IMPLANT
CANISTER SUCT 3000ML PPV (MISCELLANEOUS) ×3 IMPLANT
CLOSURE WOUND 1/2 X4 (GAUZE/BANDAGES/DRESSINGS) ×1
CONT SPEC 4OZ CLIKSEAL STRL BL (MISCELLANEOUS) ×3 IMPLANT
DRAPE C-ARM 42X72 X-RAY (DRAPES) ×6 IMPLANT
DRAPE LAPAROTOMY 100X72 PEDS (DRAPES) ×3 IMPLANT
DRAPE MICROSCOPE LEICA (MISCELLANEOUS) ×3 IMPLANT
DRAPE POUCH INSTRU U-SHP 10X18 (DRAPES) ×3 IMPLANT
DRSG OPSITE POSTOP 4X6 (GAUZE/BANDAGES/DRESSINGS) ×3 IMPLANT
DURAPREP 6ML APPLICATOR 50/CS (WOUND CARE) ×3 IMPLANT
ELECT COATED BLADE 2.86 ST (ELECTRODE) ×3 IMPLANT
ELECT REM PT RETURN 9FT ADLT (ELECTROSURGICAL) ×3
ELECTRODE REM PT RTRN 9FT ADLT (ELECTROSURGICAL) ×1 IMPLANT
GAUZE SPONGE 4X4 12PLY STRL (GAUZE/BANDAGES/DRESSINGS) ×3 IMPLANT
GAUZE SPONGE 4X4 16PLY XRAY LF (GAUZE/BANDAGES/DRESSINGS) IMPLANT
GLOVE BIO SURGEON STRL SZ8 (GLOVE) ×3 IMPLANT
GLOVE ECLIPSE 7.5 STRL STRAW (GLOVE) ×9 IMPLANT
GLOVE EXAM NITRILE LRG STRL (GLOVE) IMPLANT
GLOVE EXAM NITRILE MD LF STRL (GLOVE) IMPLANT
GLOVE EXAM NITRILE XL STR (GLOVE) ×3 IMPLANT
GLOVE EXAM NITRILE XS STR PU (GLOVE) IMPLANT
GLOVE INDICATOR 7.0 STRL GRN (GLOVE) ×3 IMPLANT
GLOVE INDICATOR 7.5 STRL GRN (GLOVE) ×9 IMPLANT
GLOVE INDICATOR 8.0 STRL GRN (GLOVE) ×3 IMPLANT
GLOVE INDICATOR 8.5 STRL (GLOVE) ×3 IMPLANT
GLOVE SURG SS PI 6.5 STRL IVOR (GLOVE) ×3 IMPLANT
GLOVE SURG SS PI 7.5 STRL IVOR (GLOVE) ×3 IMPLANT
GOWN STRL REUS W/ TWL LRG LVL3 (GOWN DISPOSABLE) ×1 IMPLANT
GOWN STRL REUS W/ TWL XL LVL3 (GOWN DISPOSABLE) ×4 IMPLANT
GOWN STRL REUS W/TWL 2XL LVL3 (GOWN DISPOSABLE) ×3 IMPLANT
GOWN STRL REUS W/TWL LRG LVL3 (GOWN DISPOSABLE) ×2
GOWN STRL REUS W/TWL XL LVL3 (GOWN DISPOSABLE) ×8
HALTER HD/CHIN CERV TRACTION D (MISCELLANEOUS) ×3 IMPLANT
HEMOSTAT POWDER KIT SURGIFOAM (HEMOSTASIS) ×3 IMPLANT
KIT BASIN OR (CUSTOM PROCEDURE TRAY) ×3 IMPLANT
KIT ROOM TURNOVER OR (KITS) ×3 IMPLANT
LIQUID BAND (GAUZE/BANDAGES/DRESSINGS) ×3 IMPLANT
NEEDLE SPNL 20GX3.5 QUINCKE YW (NEEDLE) ×3 IMPLANT
NS IRRIG 1000ML POUR BTL (IV SOLUTION) ×3 IMPLANT
PACK LAMINECTOMY NEURO (CUSTOM PROCEDURE TRAY) ×3 IMPLANT
PAD ARMBOARD 7.5X6 YLW CONV (MISCELLANEOUS) ×9 IMPLANT
PLATE ANT CERV XTEND 2 LV 32 (Plate) ×3 IMPLANT
RUBBERBAND STERILE (MISCELLANEOUS) ×6 IMPLANT
SCREW XTD VAR 4.2 SELF TAP (Screw) ×18 IMPLANT
SPACER CERVICAL FRGE 12X14X7-7 (Spacer) ×6 IMPLANT
SPONGE INTESTINAL PEANUT (DISPOSABLE) ×3 IMPLANT
SPONGE SURGIFOAM ABS GEL SZ50 (HEMOSTASIS) ×3 IMPLANT
STRIP CLOSURE SKIN 1/2X4 (GAUZE/BANDAGES/DRESSINGS) ×2 IMPLANT
SUT VIC AB 3-0 SH 8-18 (SUTURE) ×6 IMPLANT
SUT VICRYL 4-0 PS2 18IN ABS (SUTURE) ×3 IMPLANT
SYR 20ML ECCENTRIC (SYRINGE) ×3 IMPLANT
TAPE CLOTH 4X10 WHT NS (GAUZE/BANDAGES/DRESSINGS) IMPLANT
TOWEL OR 17X24 6PK STRL BLUE (TOWEL DISPOSABLE) ×3 IMPLANT
TOWEL OR 17X26 10 PK STRL BLUE (TOWEL DISPOSABLE) ×3 IMPLANT
TRAP SPECIMEN MUCOUS 40CC (MISCELLANEOUS) ×3 IMPLANT
WATER STERILE IRR 1000ML POUR (IV SOLUTION) ×3 IMPLANT

## 2015-04-17 NOTE — Plan of Care (Signed)
Problem: Consults Goal: Diagnosis - Spinal Surgery Outcome: Completed/Met Date Met:  04/17/15 Cervical Spine Fusion     

## 2015-04-17 NOTE — Progress Notes (Signed)
Orthopedic Tech Progress Note Patient Details:  Raymond Zuniga 17-May-1966 [id number]  Ortho Devices Type of Ortho Device: Soft collar Ortho Device/Splint Location: neck Ortho Device/Splint Interventions: Ordered, Application   Raymond MoccasinHughes, Raymond Zuniga 04/17/2015, 6:18 PM

## 2015-04-17 NOTE — Transfer of Care (Signed)
Immediate Anesthesia Transfer of Care Note  Patient: Raymond Zuniga  Procedure(s) Performed: Procedure(s): Cervical five-six  Cervical six-seven Anterior cervical decompression/diskectomy/fusion (N/A)  Patient Location: PACU  Anesthesia Type:General  Level of Consciousness: sedated, patient cooperative and responds to stimulation  Airway & Oxygen Therapy: Patient Spontanous Breathing and Patient connected to nasal cannula oxygen  Post-op Assessment: Report given to RN, Post -op Vital signs reviewed and stable and Patient moving all extremities  Post vital signs: Reviewed and stable  Last Vitals:  Filed Vitals:   04/17/15 1256  BP:   Pulse:   Temp: 36.9 C  Resp:     Complications: No apparent anesthesia complications

## 2015-04-17 NOTE — Care Management Utilization Note (Signed)
Utilization review completed. Benford Asch, RN Case Manager [phone]. 
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
## 2015-04-17 NOTE — Anesthesia Postprocedure Evaluation (Signed)
Anesthesia Post Note  Patient: Raymond Zuniga  Procedure(s) Performed: Procedure(s) (LRB): Cervical five-six  Cervical six-seven Anterior cervical decompression/diskectomy/fusion (N/A)  Anesthesia type: general  Patient location: PACU  Post pain: Pain level controlled  Post assessment: Patient's Cardiovascular Status Stable  Last Vitals:  Filed Vitals:   04/17/15 1404  BP:   Pulse:   Temp: 36.6 C  Resp:     Post vital signs: Reviewed and stable  Level of consciousness: sedated  Complications: No apparent anesthesia complications

## 2015-04-17 NOTE — Anesthesia Procedure Notes (Signed)
Procedure Name: Intubation Date/Time: 04/17/2015 10:20 AM Performed by: Orlinda BlalockMCMILLEN, Malva Diesing L Pre-anesthesia Checklist: Patient identified, Emergency Drugs available, Suction available, Patient being monitored and Timeout performed Patient Re-evaluated:Patient Re-evaluated prior to inductionOxygen Delivery Method: Circle system utilized Preoxygenation: Pre-oxygenation with 100% oxygen Intubation Type: IV induction Ventilation: Mask ventilation without difficulty Laryngoscope Size: Mac and 4 Grade View: Grade I Tube type: Oral Tube size: 8.0 mm Number of attempts: 1 Airway Equipment and Method: Stylet Placement Confirmation: ETT inserted through vocal cords under direct vision,  breath sounds checked- equal and bilateral and positive ETCO2 Secured at: 23 cm Tube secured with: Tape Dental Injury: Teeth and Oropharynx as per pre-operative assessment

## 2015-04-17 NOTE — Op Note (Signed)
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  Postoperative diagnosis: Same  Procedure: Anterior cervical discectomies and fusion at C5-6 C6-7 using allograft wedges in the globus extend plating system with 614 mm variable angle screws  Surgeon: Jillyn HiddenGary Dekota Shenk  Asst.: Marikay Alaravid Jones  Anesthesia: Gen.  EBL: Minimal  History of present illness: Patient is a very pleasant 49 years and was a progress worsening neck and bilateral arm pain worse on the right ran down to his forearm and his thumb and first 2 fingers consistent with C6 and C7 nerve pattern. Workup revealed spondylosis with severe stenosis at C5-6 and C6-7. Due to patient's failure conservative treatment imaging findings progression clinical syndrome I recommended anterior cervical discectomies and fusion at C5-6 and C6-7 I extensively went to the risks and benefits of the operation with the patient as well as perioperative course expectations of outcome and alternatives surgery and he understood and agreed to proceed forward.  Operative procedure: Patient brought into the or was induced under general anesthesia positioned supine the neck in slight extension 5 pounds of halter traction rest 76 prepped and draped in routine sterile fashion preoperative x-ray localize the appropriate level so a curvilinear incision was made just up midline to the anterior border of the sternocleidomastoid the superficial layer of the platysmas dissected and divided longitudinally. The avascular pin the sternomastoid and strap muscles was developed and the prevertebral fascia and this was dissected free with Kitners. Interoperative x-ray confirmed identification appropriate level so annulotomy's were made and both disc spaces anterior aspect of did not flexor rongeur the lungs close was reflected laterally and self-retaining retractors placed. Both the space were drilled down  to the posterior annulus and aspect complex. Under my Clinician first working at C6-7 aggressive abutting both endplates allowed and medication the PLL which was removed in piecemeal fashion large spurs coming off the C6 vertebral body were aggressively under been removed marking laterally to the C7 pedicle both C7 pedicles were identified both C7 nerve roots, flush with the pedicle. After adequate decompression achieved centrally and foraminally this is packed with Gelfoam to second C5-6 and a similar fashion C5-6 was drilled down and aggressive abutting both endplates and removal of the PLL allowed decompression of the central canal both C6 nerve roots were identified both C6 pedicles were identified interval foramina were decompressed. All endplates were scraped appears to be allografts to 7 mm allografts were inserted a 32 mm lobe was extend plate was selected all screws excellent purchase locking mechanisms were engaged wounds closely irrigated meticulous in space was maintained the platysmas reapproximated with interrupted Vicryls skin was closed running 4 subcuticular benzoin and Steri-Strips were applied patient recovered in stable condition. At the end the case all needle counts sponge counts were correct.

## 2015-04-17 NOTE — Anesthesia Preprocedure Evaluation (Signed)
Anesthesia Evaluation  Patient identified by MRN, date of birth, ID band Patient awake    Reviewed: Allergy & Precautions, NPO status , Patient's Chart, lab work & pertinent test results  History of Anesthesia Complications (+) PONV  Airway Mallampati: I       Dental   Pulmonary asthma , former smoker,    Pulmonary exam normal       Cardiovascular Normal cardiovascular exam    Neuro/Psych Anxiety Bipolar Disorder    GI/Hepatic GERD-  ,  Endo/Other    Renal/GU Renal disease     Musculoskeletal  (+) Arthritis -,   Abdominal   Peds  Hematology   Anesthesia Other Findings   Reproductive/Obstetrics                             Anesthesia Physical Anesthesia Plan  ASA: II  Anesthesia Plan: General   Post-op Pain Management:    Induction: Intravenous  Airway Management Planned: Oral ETT  Additional Equipment:   Intra-op Plan:   Post-operative Plan: Extubation in OR  Informed Consent: I have reviewed the patients History and Physical, chart, labs and discussed the procedure including the risks, benefits and alternatives for the proposed anesthesia with the patient or authorized representative who has indicated his/her understanding and acceptance.     Plan Discussed with: CRNA, Anesthesiologist and Surgeon  Anesthesia Plan Comments:         Anesthesia Quick Evaluation

## 2015-04-18 ENCOUNTER — Encounter (HOSPITAL_COMMUNITY): Payer: Self-pay | Admitting: Neurosurgery

## 2015-04-18 MED ORDER — OXYCODONE-ACETAMINOPHEN 5-325 MG PO TABS: 1.0000 | ORAL_TABLET | ORAL | Status: AC | PRN

## 2015-04-18 NOTE — Progress Notes (Signed)
Patient ID: Raymond Zuniga, male   DOB: 1965/10/29, 49 y.o.   MRN: [id number] Doing well no arm pain neck pain swallowing manageable  Neurologically intact and is clean dry and intact discharge home

## 2015-04-18 NOTE — Progress Notes (Signed)
Pt doing well. Pt given D/C instructions with Rx, verbal understanding was provided. Pt's incision is clean and dry with no sign of infection. Pt's IV was removed prior to D/C. Pt D/C'd home via walking @ 0930 per MD order. Pt is stable @ D/C and has no other needs at this time. Rema Fendt, RN

## 2015-04-18 NOTE — Discharge Instructions (Signed)
No lifting no bending no twisting no driving a riding a car unless he is come back and forth to see me. Keep incision clean dry and intact. May remove the outer dressing in 3-4 days leave the Steri-Strips on and intact. Cover the Steri-Strips with saran wrap her press and seal for showers only.

## 2015-04-18 NOTE — Discharge Summary (Signed)
  Physician Discharge Summary  Patient ID: Raymond Zuniga MRN: [id number] DOB/AGE: 47-Aug-1967 47 y.o.  Admit date: 04/17/2015 Discharge date: 04/18/2015  Admission Diagnoses: Cervical spondylosis with stenosis C5-6 C6-7  Discharge Diagnoses: Same Active Problems:   Spinal stenosis in cervical region   Discharged Condition: good  Hospital Course: Patient was admitted hospital underwent anterior cervical discectomies and fusion C5-6 and C6-7 postoperatively patient did very well recovered in the floor on the floor was angling and voiding spontaneously tolerating regular diet stable for discharge home. Patient will be discharged her scheduled follow-up in 2 weeks.  Consults: Significant Diagnostic Studies: ACDF C5-6 C6-7eatments: Discharge Exam: Blood pressure 134/69, pulse 73, temperature 98 F (36.7 C), temperature source Oral, resp. rate 20, height  (1.753 m), weight 129.275 kg (285 lb), SpO2 94 %.  strength out of 5 wound clean dry and intact  Disposition: home     Medication List    TAKE these medications        albuterol 108 (90 BASE) MCG/ACT inhaler  Commonly known as:  PROVENTIL HFA;VENTOLIN HFA  Inhale 2 puffs into the lungs every 6 (six) hours as needed for wheezing or shortness of breath.     amphetamine-dextroamphetamine 20 MG tablet  Commonly known as:  ADDERALL  Take 20 mg by mouth 2 (two) times daily.     buPROPion 300 MG 24 hr tablet  Commonly known as:  WELLBUTRIN XL  Take 300 mg by mouth every morning.     ibuprofen 600 MG tablet  Commonly known as:  ADVIL,MOTRIN  Take 1 tablet (600 mg total) by mouth every 6 (six) hours as needed.     meloxicam 15 MG tablet  Commonly known as:  MOBIC  Take 15 mg by mouth daily as needed for pain.     naproxen sodium 220 MG tablet  Commonly known as:  ANAPROX  Take 440 mg by mouth 2 (two) times daily with a meal.     oxyCODONE-acetaminophen 5-325 MG per tablet  Commonly known as:  PERCOCET/ROXICET  Take 1-2  tablets by mouth every 4 (four) hours as needed for moderate pain.     oxyCODONE-acetaminophen 5-325 MG per tablet  Commonly known as:  PERCOCET/ROXICET  Take 1-2 tablets by mouth every 4 (four) hours as needed for moderate pain.     traMADol 50 MG tablet  Commonly known as:  ULTRAM  Take 50-100 mg by mouth every 12 (twelve) hours as needed (pain).           Follow-up Information    Follow up with [facility], MD.   Specialty:  Neurosurgery   Contact information:   [address] [place] [address] [phone]       Signed: Mariam Dollar 04/18/2015, 7:51 AM

## 2015-10-17 ENCOUNTER — Ambulatory Visit: Payer: Medicaid Other

## 2022-09-24 ENCOUNTER — Emergency Department: Admit: 2022-09-24 | Payer: PRIVATE HEALTH INSURANCE

## 2022-09-24 ENCOUNTER — Inpatient Hospital Stay
Admit: 2022-09-24 | Discharge: 2022-09-30 | Disposition: A | Discharge status: Home / Self Care | Payer: Medicaid (Managed Care) | Admitting: Nephrology

## 2022-09-24 DIAGNOSIS — R0902 Hypoxemia: Secondary | ICD-10-CM

## 2022-09-24 DIAGNOSIS — U071 COVID-19: Principal | ICD-10-CM

## 2022-09-24 LAB — EKG 12-LEAD
P Axis: 56 degrees
P-R Interval: 182 ms
Q-T Interval: 450 ms
QRS Duration: 110 ms
QTc Calculation (Bazett): 490 ms
R Axis: 168 degrees
T Axis: 120 degrees
Ventricular Rate: 83 {beats}/min

## 2022-09-24 LAB — COMPREHENSIVE METABOLIC PANEL
ALT: 41 U/L (ref 0–50)
AST: 39 U/L (ref 0–50)
Albumin/Globulin Ratio: 1.09 (ref 1.00–2.70)
Albumin: 3.6 g/dL (ref 3.5–5.2)
Alk Phosphatase: 78 U/L (ref 40–130)
Anion Gap: 6 mmol/L (ref 2–17)
BUN: 10 mg/dL (ref 6–20)
CO2: 36 mmol/L — ABNORMAL HIGH (ref 22–29)
Calcium: 8.3 mg/dL — ABNORMAL LOW (ref 8.6–10.0)
Chloride: 101 mmol/L (ref 98–107)
Creatinine: 1 mg/dL (ref 0.7–1.3)
Est, Glom Filt Rate: 89 mL/min/1.73m (ref >=60)
Globulin: 3.3 g/dL (ref 1.9–4.4)
Glucose: 105 mg/dL — ABNORMAL HIGH (ref 70–99)
OSMOLALITY CALCULATED: 284 mOsm/kg (ref 270–287)
Potassium: 4.4 mmol/L (ref 3.5–5.3)
Sodium: 143 mmol/L (ref 135–145)
Total Bilirubin: 0.29 mg/dL (ref 0.00–1.20)
Total Protein: 6.9 g/dL (ref 6.4–8.3)

## 2022-09-24 LAB — COVID-19 & INFLUENZA COMBO (LIAT HOSPITAL)
INFLUENZA A: NOT DETECTED
INFLUENZA B: NOT DETECTED
SARS-CoV-2: DETECTED — AB

## 2022-09-24 LAB — CBC WITH AUTO DIFFERENTIAL
Absolute Baso #: 0 10*3/uL (ref 0.0–0.2)
Absolute Eos #: 0.1 10*3/uL (ref 0.0–0.5)
Absolute Lymph #: 1.5 10*3/uL (ref 1.0–3.2)
Absolute Mono #: 1.2 10*3/uL — ABNORMAL HIGH (ref 0.3–1.0)
Basophils %: 0.3 % (ref 0.0–2.0)
Eosinophils %: 2 % (ref 0.0–7.0)
Hematocrit: 53.1 % — ABNORMAL HIGH (ref 38.0–52.0)
Hemoglobin: 15.9 g/dL (ref 13.0–17.3)
Immature Grans (Abs): 0.01 10*3/uL (ref 0.00–0.06)
Immature Granulocytes %: 0.2 % (ref 0.0–0.6)
Lymphocytes: 25.9 % (ref 15.0–45.0)
MCH: 30.3 pg (ref 27.0–34.5)
MCHC: 29.9 g/dL — ABNORMAL LOW (ref 32.0–36.0)
MCV: 101.1 fL — ABNORMAL HIGH (ref 84.0–100.0)
MPV: 9.6 fL (ref 7.2–13.2)
Monocytes: 19.7 % — ABNORMAL HIGH (ref 4.0–12.0)
Neutrophils %: 51.9 % (ref 42.0–74.0)
Neutrophils Absolute: 3.1 10*3/uL (ref 1.6–7.3)
Platelets: 133 10*3/uL — ABNORMAL LOW (ref 140–440)
RBC: 5.25 x10e6/mcL (ref 4.00–5.60)
RDW: 13.8 % (ref 11.0–16.0)
WBC: 5.9 10*3/uL (ref 3.8–10.6)

## 2022-09-24 LAB — TROPONIN: Troponin, High Sensitivity: 16 ng/L (ref 0–22)

## 2022-09-24 LAB — BRAIN NATRIURETIC PEPTIDE: NT Pro-BNP: 214 pg/mL — ABNORMAL HIGH (ref 0–125)

## 2022-09-24 MED ORDER — IOPAMIDOL 76 % IV SOLN
76 % | Freq: Once | INTRAVENOUS | Status: AC | PRN
Start: 2022-09-24 — End: 2022-09-24
  Administered 2022-09-24: 19:00:00 100 mL via INTRAVENOUS

## 2022-09-24 MED ORDER — ERYTHROMYCIN 5 MG/GM OP OINT
5 MG/GM | Freq: Once | OPHTHALMIC | Status: AC
Start: 2022-09-24 — End: 2022-09-24
  Administered 2022-09-24: 20:00:00 via OPHTHALMIC

## 2022-09-24 MED ORDER — ALBUTEROL SULFATE (2.5 MG/3ML) 0.083% IN NEBU
Freq: Once | RESPIRATORY_TRACT | Status: AC
Start: 2022-09-24 — End: 2022-09-24
  Administered 2022-09-24: 18:00:00 5 mg via RESPIRATORY_TRACT

## 2022-09-24 MED FILL — ALBUTEROL SULFATE (2.5 MG/3ML) 0.083% IN NEBU: RESPIRATORY_TRACT | Qty: 6

## 2022-09-24 MED FILL — ERYTHROMYCIN 5 MG/GM OP OINT: 5 MG/GM | OPHTHALMIC | Qty: 1

## 2022-09-24 NOTE — ED Provider Notes (Signed)
[facility] EMERGENCY DEPT  EMERGENCY DEPARTMENT ENCOUNTER      Pt Name: Richard Dennis  MRN: [id number]  Valeria 06-04-1966  Date of evaluation: 09/24/2022   Provider evaluation time: 09/24/22 1237  Provider: Doreatha Martin, MD    CHIEF COMPLAINT       Chief Complaint   Patient presents with    Shortness of Breath     Pt came to ED with CC of shortness of breath.  Pt has asthma and tested positive for covid today via home test.  In ED pt has a SPO2 in the low 80's.         HISTORY OF PRESENT ILLNESS    HPI    Nursing Notes were reviewed.56 yo male presents with hypoxia and pos covid home test. Hx asthma, denies CP , admits to sob.  Denies rigors, has felt hot at home.  Quit smoking 6 mos ago. 02 sat 83% on room air at arrival.     REVIEW OF SYSTEMS       Review of Systems    Review of Systems   Constitutional:  Negative for fever and chills.     Gastrointestinal:  Negative for abdominal pain, nausea and vomiting.   Genitourinary:  Negative for dysuria, frequency, urgency.   Musculoskeletal:  Negative for back pain, arthralgias.  Skin:  Negative for generalized rash or blisters.  Neurological:  Negative for lateralizing weakness and CN deficits.      Except as noted above the remainder of the review of systems was reviewed and negative.       PAST MEDICAL HISTORY     Past Medical History:   Diagnosis Date    Asthma     Hyperlipidemia        SURGICAL HISTORY     History reviewed. No pertinent surgical history.    CURRENT MEDICATIONS       Previous Medications    ATORVASTATIN (LIPITOR) 10 MG TABLET    Take 1 tablet by mouth daily    BUSPIRONE (BUSPAR) 10 MG TABLET    Take 1 tablet by mouth 2 times daily    FLUOXETINE (PROZAC) 10 MG CAPSULE    Take 1 capsule by mouth daily    LAMOTRIGINE (LAMICTAL) 150 MG TABLET    Take 1 tablet by mouth daily    OMEPRAZOLE (PRILOSEC) 20 MG DELAYED RELEASE CAPSULE    Take 1 capsule by mouth daily       ALLERGIES     Patient has no known allergies.    FAMILY HISTORY     History  reviewed. No pertinent family history.     SOCIAL HISTORY       Social History     Socioeconomic History    Marital status: Married     Spouse name: None    Number of children: None    Years of education: None    Highest education level: None       SCREENINGS         Glasgow Coma Scale  Eye Opening: Spontaneous  Best Verbal Response: Oriented  Best Motor Response: Obeys commands  Glasgow Coma Scale Score: 15                     CIWA Assessment  BP: 115/84  Pulse: 93                 PHYSICAL EXAM    (up to 7 for level 4, 8 or  more for level 5)     ED Triage Vitals   BP Temp Temp src Pulse Resp SpO2 Height Weight   -- -- -- -- -- -- -- --       Physical Exam  Vitals and nursing note reviewed.   Constitutional:       General: He is not in acute distress.     Appearance: He is obese.   HENT:      Head: Normocephalic and atraumatic.      Mouth/Throat:      Mouth: Mucous membranes are moist.      Comments: Airway patent  Eyes:      General:         Right eye: Discharge present.         Left eye: Discharge present.     Pupils: Pupils are equal, round, and reactive to light.      Comments: Bilateral conjunctivitis   Cardiovascular:      Rate and Rhythm: Normal rate and regular rhythm.   Pulmonary:      Effort: No respiratory distress.      Breath sounds: No wheezing or rhonchi.   Abdominal:      Palpations: Abdomen is soft.      Tenderness: There is no abdominal tenderness.   Musculoskeletal:      Cervical back: Neck supple.      Comments: Neurovascular intact distally   Skin:     General: Skin is warm and dry.   Neurological:      General: No focal deficit present.      Mental Status: He is alert and oriented to person, place, and time. Mental status is at baseline.   Psychiatric:         Mood and Affect: Mood normal.         Behavior: Behavior normal.         DIAGNOSTIC RESULTS     EKG: All EKG's are interpreted by the Emergency Department Physician who either signs or Co-signs this chart in the absence of a  cardiologist.    RADIOLOGY:   Non-plain film images such as CT, Ultrasound and MRI are read by the radiologist. Plain radiographic images are visualized and preliminarily interpreted by the emergency physician with the below findings if applicable:    Interpretation per the Radiologist below, if available at the time of this note:    CTA Chest W/WO Contrast PE Eval   Final Result      No evidence of pulmonary embolus. No focal consolidations.      XR CHEST PORTABLE   Final Result   Detail limited by shallow lung inflation but otherwise no acute    abnormality is seen.                 LABS:  Labs Reviewed   COVID-19 & INFLUENZA COMBO [facility]) - Abnormal; Notable for the following components:       Result Value    SARS-CoV-2 Detected (*)     All other components within normal limits    Narrative:     Is this test for diagnosis or screening?->Diagnosis of ill patient  Symptomatic for COVID-19 as defined by CDC?->Yes  Date of Symptom Onset->09/22/22  Hospitalized for COVID-19?->No  Admitted to ICU for COVID-19?->No  Pregnant:->No   CBC WITH AUTO DIFFERENTIAL - Abnormal; Notable for the following components:    Hematocrit 53.1 (*)     MCV 101.1 (*)     MCHC 29.9 (*)  Platelets 133 (*)     Monocytes 19.7 (*)     Absolute Mono # 1.2 (*)     All other components within normal limits   COMPREHENSIVE METABOLIC PANEL - Abnormal; Notable for the following components:    CO2 36 (*)     Glucose 105 (*)     Calcium 8.3 (*)     All other components within normal limits   BRAIN NATRIURETIC PEPTIDE - Abnormal; Notable for the following components:    NT Pro-BNP 214 (*)     All other components within normal limits   TROPONIN       All other labs were within normal range or not returned as of this dictation.    EMERGENCY DEPARTMENT COURSE/REASSESSMENT and MDM:   Provider evaluation time: 09/24/22 1237  Vitals:    Vitals:    09/24/22 1431 09/24/22 1504 09/24/22 1631 09/24/22 1648   BP: 131/87  115/84    Pulse:       Resp:     22   Temp:       TempSrc:       SpO2:  94% 95%    Weight:       Height:           ED Course:    ED Course as of 09/24/22 1832   Thu Sep 24, 2022   1330 WBC: 5.9 [RL]   1330 Creatinine: 1.0 [RL]   1330 Troponin, High Sensitivity: 16  Labs reassuring, covid pending. [RL]   1432 NT Pro-BNP(!): 214 [RL]   1432 Hemoglobin Quant: 15.9 [RL]   1439 SARS-CoV-2(!): Detected [RL]   1652 CTA negative for PE or focal consolidation. [RL]   1708 EKG : sinus rhythm, RBBB, normal rate. No specific ST/T changes. Interpreted by ED physician.   [RL]      ED Course User Index  [RL] Laverta Heathrow, MD         Medical Decision Making  Pt with positive home covid test, will confirm - he is hypoxic here and not on home o2 , will require obs admission for oxygen.  Will also order CT angiogram ensuring he does not have any thromboembolic complications.    Amount and/or Complexity of Data Reviewed  Labs: ordered. Decision-making details documented in ED Course.  Radiology: ordered.  ECG/medicine tests: ordered.    Risk  Prescription drug management.  Decision regarding hospitalization.         CONSULTS:  None    PROCEDURES:  Unless otherwise noted below, none     Procedures    FINAL IMPRESSION      1. Hypoxia    2. COVID-19    3. Class 3 severe obesity with body mass index (BMI) of 50.0 to 59.9 in adult, unspecified obesity type, unspecified whether serious comorbidity present [facility])          DISPOSITION/PLAN   DISPOSITION Admitted 09/24/2022 01:39:30 PM      PATIENT REFERRED TO:  No follow-up provider specified.    DISCHARGE MEDICATIONS:  New Prescriptions    No medications on file          (Please note that portions of this note were completed with a voice recognition program.  Efforts were made to edit the dictations but occasionally words are mis-transcribed.)    Laverta [place], MD (electronically signed)  Attending Emergency Physician           Laverta [place], MD  09/24/22 [phone]

## 2022-09-24 NOTE — ED Notes (Signed)
Pt report given to A[place]on RN

## 2022-09-24 NOTE — H&P (Signed)
[facility] Hospitalist Service     Hospitalist H & P     Name: Richard Dennis   DOB: 06/19/1966 (Age: 56 y.o.)   Date of Admission: 09/25/22    Primary Care Provider: No primary care provider on file.  Attending: Jaclyn Shaggy, MD  Medical Decision Maker: Extended Emergency Contact Information  Primary Emergency Contact: Zito,Theresa  Mobile Phone: [phone]  Relation: Spouse  Preferred language: English  Interpreter needed? No    Chief Complaint:       Chief Complaint   Patient presents with    Shortness of Breath     Pt came to ED with CC of shortness of breath.  Pt has asthma and tested positive for covid today via home test.  In ED pt has a SPO2 in the low 80's.       History of Present Illness    Richard Dennis is a very pleasant 42 gentleman with a history of asthma, high BMI, anxiety, dyslipidemia presented to the emergency room at [facility] with complaints of worsening shortness of breath cough as well as positive COVID test at home and low oxygen level.  In the emergency room he was retested for COVID and was found to be positive and he was found to be hypoxic.  Internal medicine was consulted to admit him to the inpatient service under the hospitalist team at [facility] for further evaluation and if needed follow-up with infectious disease.    As per the patient he was doing good until about 2 to 3 days ago.  Past few days he is getting some discharge from the eyes as well as worsening weakness and body pain.  He checked his oxygen level it was found to be in this as well as he was found to have positive COVID negative.  He got alerted and came to the emergency room for further evaluation.    No complaints of headache, vomiting, chest pain, pain in the abdomen bowel or bladder problems except for the cough body pain conjunctival discharge as well as a low oxygen level.       Past Medical History     Past Medical History:   Diagnosis Date    Asthma      Hyperlipidemia     Obesity         Past Surgical History   History reviewed. No pertinent surgical history.     Allergies   Patient has no known allergies.     Medications     Current Outpatient Medications   Medication Instructions    atorvastatin (LIPITOR) 10 mg, Oral, DAILY    busPIRone (BUSPAR) 10 mg, Oral, 2 TIMES DAILY    FLUoxetine (PROZAC) 10 mg, Oral, DAILY    lamoTRIgine (LAMICTAL) 150 mg, Oral, DAILY    omeprazole (PRILOSEC) 20 mg, Oral, DAILY         Family History   History reviewed. No pertinent family history.     Social History      Social History     Socioeconomic History    Marital status: Married     Spouse name: Not on file    Number of children: Not on file    Years of education: Not on file    Highest education level: Not on file   Occupational History    Not on file   Tobacco Use    Smoking status: Former     Types: Cigarettes    Smokeless  tobacco: Not on file   Substance and Sexual Activity    Alcohol use: Not on file    Drug use: Not on file    Sexual activity: Not on file   Other Topics Concern    Not on file   Social History Narrative    Not on file     Social Determinants of Health     Financial Resource Strain: Not on file   Food Insecurity: No Food Insecurity (09/24/2022)    Hunger Vital Sign     Worried About Running Out of Food in the Last Year: Never true     Ran Out of Food in the Last Year: Never true   Transportation Needs: No Transportation Needs (09/24/2022)    PRAPARE - Armed forces logistics/support/administrative officer (Medical): No     Lack of Transportation (Non-Medical): No   Physical Activity: Not on file   Stress: Not on file   Social Connections: Not on file   Intimate Partner Violence: Not on file   Housing Stability: Low Risk  (09/24/2022)    Housing Stability Vital Sign     Unable to Pay for Housing in the Last Year: No     Number of Places Lived in the Last Year: 2     Unstable Housing in the Last Year: No        Review of Systems (positives bolded, otherwise negative)        14 point review of system negative except for this body pain, cough conjunctival discharge and low oxygenation.     Physical Exam     Vitals:    09/24/22 2209 09/25/22 0015 09/25/22 0025 09/25/22 0315   BP: (!) 132/95 (!) 139/96  (!) 155/105   Pulse: 81 85  83   Resp: _0 Temp: 98.5 F (36.9 C) 98.3 F (36.8 C)  98.1 F (36.7 C)   TempSrc: Oral Oral  Oral   SpO2: 97% 93%  94%   Weight:   (!) 154.6 kg (340 lb 13.3 oz)    Height:         Currently not in any major distress  Alert awake oriented x 4  Head, there is no external injuries, pupil reacting equally to light and accommodation, conjunctival discharge and conjunctivitis seen, extensive rash over the face possibly tenia Barbarae  Tongue is moist, throat clear  Neck supple there is no jugular vein distention there is no significant carotid bruit  Chest mild wheeze bilaterally no rales or rhonchi  Heart regular there is no significant murmur  Abdomen soft nontender there is no gross organomegaly, obese  Urinary bladder nondistended  Extremities there is no pedal edema distal pulses and sensation in the  Cranial nerves II through grossly intact  Psychiatric anxious but stable         Labs and Imaging     Labs:   Hematologic/Coags Chemistries   Recent Labs     09/24/22  1255   WBC 5.9   HGB 15.9   HCT 53.1*   PLT 133*     Lab Results   Component Value Date/Time    PROT 6.9 09/24/2022 01:13 PM    ALBUMIN 1.09 09/24/2022 01:13 PM     No components found for: "HGBA1C"  No results found for: "INR", "PROTIME"  No results found for: "APTT"  No results found for: "DDIMER"   Recent Labs     09/24/22  1313  NA 143   K 4.4   CL 101   CO2 36*   BUN 10   CREATININE 1.0   ALBUMIN 1.09   BILITOT 0.29   ALKPHOS 78   AST 39   ALT 41     No results for input(s): "GLU" in the last 72 hours.  No results found for: "CPK", "CKMB", "TROPONINI"  No results found for: "IRON", "FERRITIN"     Inflammatory/Respiratory Diabetes   No results found for: "CRP"  No results found  for: "ESR"  ABGs:  No results found for: "PHART", "PO2ART", "HCO3", "PCO2ART"   Lab Results   Component Value Date/Time    CREATININE 1.0 09/24/2022 01:13 PM                CTA Chest W/WO Contrast PE Eval   Final Result      No evidence of pulmonary embolus. No focal consolidations.      XR CHEST PORTABLE   Final Result   Detail limited by shallow lung inflation but otherwise no acute    abnormality is seen.             Past Cardiac History   None  Past Microbiologic History (if applicable)  No results for input(s): "SDES", "CULTURE" in the last 72 hours.     [place] [facility] Problems             Last Modified POA    * (Principal) Pneumonia due to COVID-19 virus 09/25/2022 Yes    Hypoxia 09/25/2022 Yes    Asthma 09/25/2022 Yes    Sleep apnea 09/25/2022 Yes    Bacterial conjunctivitis 09/25/2022 Yes    Tinea barbae 09/25/2022 Yes    Obesity (BMI 30.0-34.9) 09/25/2022 Yes        1.  COVID-19 pneumonia causing hypoxic respiratory failure.  Will continue enhanced isolation protocol, incentive spirometry, dexamethasone 8 mg IV every 24, empiric antibiotics Rocephin and doxycycline for now, will hold remdesivir for now and if needed will get infectious disease on board, empiric vitamin supplementation with vitamin D, zinc and vitamin C, depending on the clinical response and inflammatory markers will decide whether he is a candidate for remdesivir, plasma infusion or biologicals.  Will get infectious disease on board if needed.    2, asthma will continue albuterol as needed.    3.  High BMI counseling was given.    4.  Anxiety will continue BuSpar 10 mg twice daily, fluoxetine 10 mg daily    5.  Conjunctivitis, continue isolation protocol and gentamicin eyedrops.    6.  Norva Pavlov will try a course of hydrocortisone as well as clotrimazole cream.    7.  Possible obstructive sleep apnea, outpatient pulmonology evaluation.    8.  Dyslipidemia will continue Lipitor 10 mg at bedtime.    9.  GI prophylaxis  continue Pepcid    10.  DVT prophylaxis will continue low-dose Lovenox.      Code Status: Full Code     Patient is currently full code and he is the decision maker along with the family.  The patient may need 2 or more nights of hospitalization and 2 or more days in hospital care because of the complex recent medical issues.  This message was discussed with the patient understood everything and agreed with the plan.  This message was discussed with the patient's nurse as well as the ER physician who agreed with the plan.  Total time taken  more than 55 minutes that includes counseling and coordination.    Disposition: Home with home health    Jaclyn Shaggy, MD  09/25/2022 5:29 AM  [facility] Hospitalist Service    ++++++++++++++++++++++++++++++++++++++++    This note was created using voice recognition software and may contain typographic errors missed during final review. The intent is to have a complete and accurate medical record.   As a valued partner in this safety effort, if you have noted factual errors, please complete the Health Information Amendment/Correct Form or call the [facility] Management Office at [phone].

## 2022-09-25 LAB — COMPREHENSIVE METABOLIC PANEL W/ REFLEX TO MG FOR LOW K
ALT: 45 U/L (ref 0–50)
AST: 44 U/L (ref 0–50)
Albumin/Globulin Ratio: 1 (ref 1.00–2.70)
Albumin: 3.6 g/dL (ref 3.5–5.2)
Alk Phosphatase: 75 U/L (ref 40–130)
Anion Gap: 8 mmol/L (ref 2–17)
BUN: 11 mg/dL (ref 6–20)
CALCIUM,CORRECTED,CCA: 9 mg/dL (ref 8.6–10.0)
CO2: 30 mmol/L — ABNORMAL HIGH (ref 22–29)
Calcium: 8.2 mg/dL — ABNORMAL LOW (ref 8.6–10.0)
Chloride: 100 mmol/L (ref 98–107)
Creatinine: 0.8 mg/dL (ref 0.7–1.3)
Est, Glom Filt Rate: 105 mL/min/1.73m (ref >=60)
Globulin: 3.6 g/dL (ref 1.9–4.4)
Glucose: 156 mg/dL — ABNORMAL HIGH (ref 70–99)
OSMOLALITY CALCULATED: 278 mOsm/kg (ref 270–287)
Potassium: 5.3 mmol/L (ref 3.5–5.3)
Sodium: 138 mmol/L (ref 135–145)
Total Bilirubin: 0.25 mg/dL (ref 0.00–1.20)
Total Protein: 7.2 g/dL (ref 6.4–8.3)

## 2022-09-25 LAB — CBC WITH AUTO DIFFERENTIAL
Absolute Baso #: 0 10*3/uL (ref 0.0–0.2)
Absolute Lymph #: 0.6 10*3/uL — ABNORMAL LOW (ref 1.0–3.2)
Absolute Mono #: 0.1 10*3/uL — ABNORMAL LOW (ref 0.3–1.0)
Basophils %: 0.3 % (ref 0.0–2.0)
Eosinophils %: 0 % (ref 0.0–7.0)
Eosinophils Absolute: 0 10*3/uL (ref 0.0–0.5)
Hematocrit: 53.6 % — ABNORMAL HIGH (ref 38.0–52.0)
Hemoglobin: 16 g/dL (ref 13.0–17.3)
Immature Grans (Abs): 0.01 10*3/uL (ref 0.00–0.06)
Immature Granulocytes: 0.2 % (ref 0.0–0.6)
Lymphocytes: 10 % — ABNORMAL LOW (ref 15.0–45.0)
MCH: 30.5 pg (ref 27.0–34.5)
MCHC: 29.9 g/dL — ABNORMAL LOW (ref 32.0–36.0)
MCV: 102.1 fL — ABNORMAL HIGH (ref 84.0–100.0)
MPV: 10 fL (ref 7.2–13.2)
Monocytes: 2.4 % — ABNORMAL LOW (ref 4.0–12.0)
NRBC Absolute: 0 10*3/uL (ref 0.000–0.012)
NRBC Automated: 0 % (ref 0.0–0.2)
Neutrophils %: 87.1 % — ABNORMAL HIGH (ref 42.0–74.0)
Neutrophils Absolute: 5.1 10*3/uL (ref 1.6–7.3)
Platelets: 152 10*3/uL (ref 140–440)
RBC: 5.25 x10e6/mcL (ref 4.00–5.60)
RDW: 13.8 % (ref 11.0–16.0)
WBC: 5.8 10*3/uL (ref 3.8–10.6)

## 2022-09-25 LAB — C. DIFFICILE TOXIN A/B
C Diff Toxin Interpretation: NEGATIVE
C.diff Toxin/Antigen: NEGATIVE

## 2022-09-25 LAB — LIPID PANEL
Chol/HDL Ratio: 3.1 (ref 0.0–4.4)
Cholesterol: 114 mg/dL (ref 100–200)
HDL: 37 mg/dL — ABNORMAL LOW (ref >=40)
LDL Cholesterol: 61 mg/dL (ref 0.0–100.0)
LDL/HDL Ratio: 1.6
Triglycerides: 80 mg/dL (ref 0–149)
VLDL: 16 mg/dL (ref 5.0–40.0)

## 2022-09-25 LAB — PHOSPHORUS: Phosphorus: 2.5 mg/dL (ref 2.5–4.5)

## 2022-09-25 LAB — FERRITIN: Ferritin: 198.7 ng/mL (ref 30.0–400.0)

## 2022-09-25 LAB — PROCALCITONIN: Procalcitonin: 0.05 ng/mL (ref <=0.24)

## 2022-09-25 LAB — D-DIMER, QUANTITATIVE: D-Dimer, Quant: 0.39 CD:295178345 (ref 0.19–0.50)

## 2022-09-25 LAB — POTASSIUM W/ REFLEX TO MAGNESIUM: Potassium: 5.3 mmol/L (ref 3.5–5.3)

## 2022-09-25 LAB — TSH WITH REFLEX: TSH: 1.31 mcIU/mL (ref 0.358–3.740)

## 2022-09-25 LAB — CK: Total CK: 400 U/L — ABNORMAL HIGH (ref 20–200)

## 2022-09-25 LAB — C-REACTIVE PROTEIN: CRP: 3.27 mg/dL — ABNORMAL HIGH (ref 0.00–0.50)

## 2022-09-25 MED ORDER — SODIUM CHLORIDE 0.9 % IV SOLN
0.9 % | INTRAVENOUS | Status: AC
Start: 2022-09-25 — End: 2022-09-30
  Administered 2022-09-26 – 2022-09-29 (×4): 100 mg via INTRAVENOUS

## 2022-09-25 MED ORDER — NORMAL SALINE FLUSH 0.9 % IV SOLN
0.9 % | Freq: Two times a day (BID) | INTRAVENOUS | Status: DC
Start: 2022-09-25 — End: 2022-09-30
  Administered 2022-09-25 – 2022-09-30 (×12): 10 mL via INTRAVENOUS

## 2022-09-25 MED ORDER — ENOXAPARIN SODIUM 40 MG/0.4ML IJ SOSY
40 MG/0.4ML | Freq: Two times a day (BID) | INTRAMUSCULAR | Status: DC
Start: 2022-09-25 — End: 2022-09-25
  Administered 2022-09-25: 04:00:00 40 mg via SUBCUTANEOUS

## 2022-09-25 MED ORDER — MELATONIN 3 MG PO TABS
3 MG | Freq: Every evening | ORAL | Status: DC
Start: 2022-09-25 — End: 2022-09-30
  Administered 2022-09-25 – 2022-09-30 (×6): 3 mg via ORAL

## 2022-09-25 MED ORDER — IPRATROPIUM-ALBUTEROL 0.5-2.5 (3) MG/3ML IN SOLN
RESPIRATORY_TRACT | Status: DC | PRN
Start: 2022-09-25 — End: 2022-09-30
  Administered 2022-09-25: 17:00:00 1 via RESPIRATORY_TRACT

## 2022-09-25 MED ORDER — ALBUTEROL SULFATE HFA 108 (90 BASE) MCG/ACT IN AERS
108 (90 Base) MCG/ACT | RESPIRATORY_TRACT | Status: DC | PRN
Start: 2022-09-25 — End: 2022-09-30

## 2022-09-25 MED ORDER — SODIUM CHLORIDE 0.9 % IV SOLN
0.9 % | INTRAVENOUS | Status: DC | PRN
Start: 2022-09-25 — End: 2022-09-30

## 2022-09-25 MED ORDER — LAMOTRIGINE 100 MG PO TABS
100 MG | Freq: Every day | ORAL | Status: DC
Start: 2022-09-25 — End: 2022-09-30
  Administered 2022-09-25 – 2022-09-30 (×6): 150 mg via ORAL

## 2022-09-25 MED ORDER — REMDESIVIR 100 MG/20 ML (MIXTURES ONLY)
100 MG | Freq: Once | INTRAVENOUS | Status: AC
Start: 2022-09-25 — End: 2022-09-25
  Administered 2022-09-25: 21:00:00 200 mg via INTRAVENOUS

## 2022-09-25 MED ORDER — SODIUM CHLORIDE 0.9 % IV SOLN (ADD-VANTAGE)
0.9 % | INTRAVENOUS | Status: DC
Start: 2022-09-25 — End: 2022-09-25
  Administered 2022-09-25: 04:00:00 2000 mg via INTRAVENOUS

## 2022-09-25 MED ORDER — ACETAMINOPHEN 325 MG PO TABS
325 MG | Freq: Four times a day (QID) | ORAL | Status: DC | PRN
Start: 2022-09-25 — End: 2022-09-30
  Administered 2022-09-27: 05:00:00 650 mg via ORAL

## 2022-09-25 MED ORDER — DEXAMETHASONE SODIUM PHOSPHATE 4 MG/ML IJ SOLN
4 MG/ML | INTRAMUSCULAR | Status: DC
Start: 2022-09-25 — End: 2022-09-25
  Administered 2022-09-25: 04:00:00 8 mg via INTRAVENOUS

## 2022-09-25 MED ORDER — DEXAMETHASONE 6 MG PO TABS
6 MG | Freq: Every day | ORAL | Status: DC
Start: 2022-09-25 — End: 2022-09-30
  Administered 2022-09-26 – 2022-09-30 (×5): 6 mg via ORAL

## 2022-09-25 MED ORDER — NORMAL SALINE FLUSH 0.9 % IV SOLN
0.9 % | INTRAVENOUS | Status: DC | PRN
Start: 2022-09-25 — End: 2022-09-30

## 2022-09-25 MED ORDER — ONDANSETRON 4 MG PO TBDP
4 MG | Freq: Three times a day (TID) | ORAL | Status: DC | PRN
Start: 2022-09-25 — End: 2022-09-30

## 2022-09-25 MED ORDER — VITAMIN D 25 MCG (1000 UT) PO TABS
25 MCG (1000 UT) | Freq: Every day | ORAL | Status: DC
Start: 2022-09-25 — End: 2022-09-25
  Administered 2022-09-25: 14:00:00 2000 [IU] via ORAL

## 2022-09-25 MED ORDER — FAMOTIDINE 20 MG PO TABS
20 MG | Freq: Two times a day (BID) | ORAL | Status: DC
Start: 2022-09-25 — End: 2022-09-30
  Administered 2022-09-25 – 2022-09-30 (×12): 20 mg via ORAL

## 2022-09-25 MED ORDER — FLUOXETINE HCL 10 MG PO CAPS
10 MG | Freq: Every day | ORAL | Status: DC
Start: 2022-09-25 — End: 2022-09-30
  Administered 2022-09-25 – 2022-09-30 (×6): 10 mg via ORAL

## 2022-09-25 MED ORDER — DOXYCYCLINE HYCLATE 100 MG IV SOLR
100 MG | Freq: Two times a day (BID) | INTRAVENOUS | Status: DC
Start: 2022-09-25 — End: 2022-09-25
  Administered 2022-09-25 (×2): 100 mg via INTRAVENOUS

## 2022-09-25 MED ORDER — ASCORBIC ACID 500 MG PO TABS
500 MG | Freq: Two times a day (BID) | ORAL | Status: DC
Start: 2022-09-25 — End: 2022-09-25
  Administered 2022-09-25 (×2): 500 mg via ORAL

## 2022-09-25 MED ORDER — POTASSIUM CHLORIDE CRYS ER 20 MEQ PO TBCR
20 MEQ | ORAL | Status: DC | PRN
Start: 2022-09-25 — End: 2022-09-30

## 2022-09-25 MED ORDER — ENOXAPARIN SODIUM 40 MG/0.4ML IJ SOSY
40 MG/0.4ML | Freq: Two times a day (BID) | INTRAMUSCULAR | Status: DC
Start: 2022-09-25 — End: 2022-09-30
  Administered 2022-09-26 – 2022-09-30 (×10): 40 mg via SUBCUTANEOUS

## 2022-09-25 MED ORDER — HYDROCORTISONE 1 % EX CREA
1 % | Freq: Two times a day (BID) | CUTANEOUS | Status: DC
Start: 2022-09-25 — End: 2022-09-25
  Administered 2022-09-25 (×2): via TOPICAL

## 2022-09-25 MED ORDER — ONDANSETRON HCL 4 MG/2ML IJ SOLN
4 MG/2ML | Freq: Four times a day (QID) | INTRAMUSCULAR | Status: DC | PRN
Start: 2022-09-25 — End: 2022-09-30

## 2022-09-25 MED ORDER — ZINC GLUCONATE 50 MG PO TABS
50 MG | Freq: Every day | ORAL | Status: DC
Start: 2022-09-25 — End: 2022-09-30
  Administered 2022-09-25 – 2022-09-30 (×6): 50 mg via ORAL

## 2022-09-25 MED ORDER — POTASSIUM BICARB-CITRIC ACID 20 MEQ PO TBEF
20 MEQ | ORAL | Status: DC | PRN
Start: 2022-09-25 — End: 2022-09-30

## 2022-09-25 MED ORDER — ACETAMINOPHEN 650 MG RE SUPP
650 MG | Freq: Four times a day (QID) | RECTAL | Status: DC | PRN
Start: 2022-09-25 — End: 2022-09-30

## 2022-09-25 MED ORDER — POTASSIUM CHLORIDE 10 MEQ/100ML IV SOLN
10 MEQ/0ML | INTRAVENOUS | Status: DC | PRN
Start: 2022-09-25 — End: 2022-09-30

## 2022-09-25 MED ORDER — POLYETHYLENE GLYCOL 3350 17 G PO PACK
17 g | Freq: Every day | ORAL | Status: DC | PRN
Start: 2022-09-25 — End: 2022-09-30
  Administered 2022-09-29: 16:00:00 17 g via ORAL

## 2022-09-25 MED ORDER — CLOTRIMAZOLE 1 % EX CREA
1 % | Freq: Two times a day (BID) | CUTANEOUS | Status: DC
Start: 2022-09-25 — End: 2022-09-25
  Administered 2022-09-25 (×2): via TOPICAL

## 2022-09-25 MED ORDER — GENTAMICIN SULFATE 0.3 % OP SOLN
0.3 % | Freq: Four times a day (QID) | OPHTHALMIC | Status: DC
Start: 2022-09-25 — End: 2022-09-25
  Administered 2022-09-25 (×3): 1 [drp] via OPHTHALMIC

## 2022-09-25 MED ORDER — MAGNESIUM SULFATE 2 GM/50ML IV SOLN
2 GM/50ML | INTRAVENOUS | Status: DC | PRN
Start: 2022-09-25 — End: 2022-09-30

## 2022-09-25 MED ORDER — ENOXAPARIN SODIUM 40 MG/0.4ML IJ SOSY
40 MG/0.4ML | Freq: Every day | INTRAMUSCULAR | Status: DC
Start: 2022-09-25 — End: 2022-09-25
  Administered 2022-09-25: 14:00:00 40 mg via SUBCUTANEOUS

## 2022-09-25 MED ORDER — LACTATED RINGERS IV SOLN
INTRAVENOUS | Status: AC
Start: 2022-09-25 — End: 2022-09-25
  Administered 2022-09-25: 04:00:00 via INTRAVENOUS

## 2022-09-25 MED ORDER — BUSPIRONE HCL 5 MG PO TABS
5 MG | Freq: Two times a day (BID) | ORAL | Status: DC
Start: 2022-09-25 — End: 2022-09-30
  Administered 2022-09-25 – 2022-09-30 (×12): 10 mg via ORAL

## 2022-09-25 MED ORDER — ATORVASTATIN CALCIUM 10 MG PO TABS
10 MG | Freq: Every evening | ORAL | Status: DC
Start: 2022-09-25 — End: 2022-09-30
  Administered 2022-09-25 – 2022-09-30 (×6): 10 mg via ORAL

## 2022-09-25 MED FILL — BUSPIRONE HCL 5 MG PO TABS: 5 MG | ORAL | Qty: 2

## 2022-09-25 MED FILL — ENOXAPARIN SODIUM 40 MG/0.4ML IJ SOSY: 40 MG/0.4ML | INTRAMUSCULAR | Qty: 0.4

## 2022-09-25 MED FILL — LAMOTRIGINE 100 MG PO TABS: 100 MG | ORAL | Qty: 2

## 2022-09-25 MED FILL — FAMOTIDINE 20 MG PO TABS: 20 MG | ORAL | Qty: 1

## 2022-09-25 MED FILL — VITAMIN C 500 MG PO TABS: 500 MG | ORAL | Qty: 1

## 2022-09-25 MED FILL — IPRATROPIUM-ALBUTEROL 0.5-2.5 (3) MG/3ML IN SOLN: RESPIRATORY_TRACT | Qty: 3

## 2022-09-25 MED FILL — ATORVASTATIN CALCIUM 10 MG PO TABS: 10 MG | ORAL | Qty: 1

## 2022-09-25 MED FILL — ZINC GLUCONATE 50 MG PO TABS: 50 MG | ORAL | Qty: 1

## 2022-09-25 MED FILL — MELATONIN 3 MG PO TABS: 3 MG | ORAL | Qty: 1

## 2022-09-25 MED FILL — VEKLURY 100 MG IV SOLR: 100 MG | INTRAVENOUS | Qty: 40

## 2022-09-25 MED FILL — DOXYCYCLINE HYCLATE 100 MG IV SOLR: 100 MG | INTRAVENOUS | Qty: 100

## 2022-09-25 MED FILL — VITAMIN D3 25 MCG (1000 UT) PO TABS: 25 MCG (1000 UT) | ORAL | Qty: 2

## 2022-09-25 MED FILL — FLUOXETINE HCL 10 MG PO CAPS: 10 MG | ORAL | Qty: 1

## 2022-09-25 MED FILL — DEXAMETHASONE SODIUM PHOSPHATE 4 MG/ML IJ SOLN: 4 MG/ML | INTRAMUSCULAR | Qty: 2

## 2022-09-25 MED FILL — CLOTRIMAZOLE 1 % EX CREA: 1 % | CUTANEOUS | Qty: 30

## 2022-09-25 MED FILL — ALBUTEROL SULFATE HFA 108 (90 BASE) MCG/ACT IN AERS: 108 (90 Base) MCG/ACT | RESPIRATORY_TRACT | Qty: 6.7

## 2022-09-25 MED FILL — CEFTRIAXONE SODIUM 2 G IV SOLR: 2 g | INTRAVENOUS | Qty: 2000

## 2022-09-25 MED FILL — NORMAL SALINE FLUSH 0.9 % IV SOLN: 0.9 % | INTRAVENOUS | Qty: 10

## 2022-09-25 MED FILL — GENTAMICIN SULFATE 0.3 % OP SOLN: 0.3 % | OPHTHALMIC | Qty: 5

## 2022-09-25 MED FILL — HYDROCORTISONE 1 % EX CREA: 1 % | CUTANEOUS | Qty: 28.4

## 2022-09-25 NOTE — Progress Notes (Signed)
Progress Note    Date:09/25/2022       Room:0626/01  Patient Name:Richard Dennis     Date of Birth:[birth year]     Age:56 y.o.    Subjective   Patient Sitting in bed resting comfortably watching TV on 3L NC. He reports feeling better with oxygen on, but is SOB during our conversation. Has noted productive cough and only has pain associated with cough. Currently afebrile and hemodynamically stable. Discussed with RN to provide IS and Flutter Valve.     Medications   Scheduled Meds:    enoxaparin  40 mg SubCUTAneous Daily    dexAMETHasone  8 mg IntraVENous Q24H    cefTRIAXone (ROCEPHIN) IV  2,000 mg IntraVENous Q24H    doxycycline (VIBRAMYCIN) IV  100 mg IntraVENous Q12H    famotidine  20 mg Oral BID    vitamin C  500 mg Oral BID    Vitamin D  2,000 Units Oral Daily    zinc gluconate  50 mg Oral Daily    atorvastatin  10 mg Oral Nightly    busPIRone  10 mg Oral BID    FLUoxetine  10 mg Oral Daily    lamoTRIgine  150 mg Oral Daily    sodium chloride flush  5-40 mL IntraVENous 2 times per day    melatonin  3 mg Oral Nightly    gentamicin  1 drop Both Eyes 4x Daily    clotrimazole   Topical BID    hydrocortisone   Topical BID     Continuous Infusions:    sodium chloride       PRN Meds: albuterol sulfate HFA, sodium chloride flush, sodium chloride, potassium chloride **OR** potassium alternative oral replacement **OR** potassium chloride, magnesium sulfate, ondansetron **OR** ondansetron, polyethylene glycol, acetaminophen **OR** acetaminophen    Physical Examination      Vitals:  BP (!) 137/99   Pulse 85   Temp 97.6 F (36.4 C) (Oral)   Resp 23   Ht 1.753 m (5\' 9" )   Wt (!) 154.6 kg (340 lb 13.3 oz)   SpO2 92%   BMI 50.33 kg/m   Temp (24hrs), Avg:98.1 F (36.7 C), Min:97.6 F (36.4 C), Max:98.5 F (36.9 C)      I/O (24Hr):    Intake/Output Summary (Last 24 hours) at 09/25/2022 09/27/2022  Last data filed at 09/25/2022 0023  Gross per 24 hour   Intake --   Output 650 ml   Net -650 ml         Morbidly Obese Caucasian  male, NAD.   Head, there is no external injuries, pupil reacting equally to light and accommodation, conjunctival discharge and conjunctivitis seen, extensive rash over the face looks like tenia Barbarae  Tongue is moist, throat clear  Neck supple there is no jugular vein distention there is no significant carotid bruit  Chest mild wheeze bilaterally no rales or rhonchi, 3L NC  Heart regular rate and no significant murmur  Abdomen soft nontender there is no gross organomegaly, obese  Urinary bladder nondistended  Extremities there is no pedal edema distal pulses and sensation in the  grossly intact  Psychiatric calm, appropriate mood and affect.    Labs/Imaging/Diagnostics   Labs:  CBC:  Recent Labs     09/24/22  1255 09/25/22  0640   WBC 5.9 5.8   RBC 5.25 5.25   HGB 15.9 16.0   HCT 53.1* 53.6*   MCV 101.1* 102.1*   RDW 13.8 13.8   PLT 133* 152  CHEMISTRIES:  Recent Labs     09/24/22  1313   NA 143   K 4.4   CL 101   CO2 36*   BUN 10   CREATININE 1.0   GLUCOSE 105*     PT/INR:No results for input(s): "PROTIME", "INR" in the last 72 hours.  APTT:No results for input(s): "APTT" in the last 72 hours.  LIVER PROFILE:  Recent Labs     09/24/22  1313   AST 39   ALT 41   BILITOT 0.29   ALKPHOS 78       Imaging Last 24 Hours:  CTA Chest W/WO Contrast PE Eval    Result Date: 09/24/2022  CTA chest: 09/24/22 INDICATION: Concern for PE R09.02,Hypoxemia,ICD-10-CM COMPARISON: Chest radiograph 09/24/2022 TECHNIQUE: PE protocol (Postcontrast imaging from the thoracic inlet through the  hemidiaphragms in the pulmonary arterial phase. Axial 5x5 mm soft tissue and lung 2x2 mm images. Multiplanar 3-D volumetric MIP reconstructions through the pulmonary arteries per protocol.) CT scanning was performed using radiation dose  reduction techniques when appropriate, per system protocols. Pulmonary arteries: No evidence of a pulmonary embolus with diagnostic confidence to the level the segmental arteries. Lungs/Airways: No suspicious  nodules or consolidations. The airways are patent. Pleura: No pleural effusion or pneumothorax. Lymph Nodes: No mediastinal, hilar, or axillary adenopathy. Cardiovascular: Normal heart size. No pericardial effusion. Osseous structures: No suspicious lytic or blastic osseous lesions. Partially visualized ACDF hardware. Upper Abdomen: Hepatic steatosis. Cholecystectomy.     No evidence of pulmonary embolus. No focal consolidations.    XR CHEST PORTABLE    Result Date: 09/24/2022  PORTABLE AP VIEW OF THE CHEST DATE: 09/24/22.  INDICATION:Shortness of Breath. COMPARISON:  None. NUMBER OF RADIOGRAPHIC IMAGES: 1 FINDINGS:  Heart and mediastinal contours are accentuated by shallow lung volumes. Lung detail is also limited by shallow lung volumes but no definite consolidation is seen. No evidence of pneumothorax or acute osseous abnormality.     Detail limited by shallow lung inflation but otherwise no acute abnormality is seen.         Assessment        Hospital Problems             Last Modified POA    * (Principal) Pneumonia due to COVID-19 virus 09/25/2022 Yes    Hypoxia 09/25/2022 Yes    Asthma 09/25/2022 Yes    Sleep apnea 09/25/2022 Yes    Bacterial conjunctivitis 09/25/2022 Yes    Tinea barbae 09/25/2022 Yes    Obesity (BMI 30.0-34.9) 09/25/2022 Yes       Plan:        COVID-19 pneumonia with hypoxic respiratory failure  Onset of symptoms 12/26. CXR unimpressive with noted shallow lung volumes. WBC WNL. Isolation precautions in place   -Continues on 3L of supplemental oxygen, wean as tolerated for SaO2 >92%  -IV Decadron 4 mg daily  -Empiric Rocephin and doxycycline  -Consulted ID as patient is candidate for remdesivir with comorbidities of morbid obesity and asthma.  -empiric vitamin D, zinc and vitamin C  -Encourage continuous pulmonary toileting with IS and Flutter valve  -Close clinical monitoring    Asthma   Continue albuterol as needed.  -duonebs PRN     Morbidly Obese  -BMI 50.33  -Complicates all aspects of  care    Anxiety   continue home BuSpar and fluoxetine     Conjunctivitis  -gentamicin eyedrops.     Tenia Barbarae   -cont hydrocortisone  -cont clotrimazole cream.  Possible obstructive sleep apnea  -recommend outpatient pulmonology evaluation  -if respiratory status declines, will consult this hospitalization.     Dyslipidemia   -continue home Lipitor 10     Diet:Reg  GI prophylaxis continue Pepcid  DVT prophylaxis  Lovenox.  Code Status: Full Code   Disposition: Inpatient, pending clinical condition, and ID recs       Electronically signed by Lovenia Shuck, APRN - NP on 09/25/22 at 8:26 AM EST

## 2022-09-25 NOTE — Consults (Signed)
Pt is a 56yo M with positive test for SARS-CoV2.  Admit date 12/28  Afebrile since admission  Sx's: cough, SOB, conjunctivitis  Sx onset date: 12/25  O2 requirement: 3L   Comorbidities: asthma, BMI>25  Hx vaccination: x3  Wear O2 at home: no  Hx of COVID prior: no  Bradycardia: no  Take chloroquine or hydroxychloroquine at home: no    Allergies:  Patient has no known allergies.    Medications home:  Prior to Visit Medications    Medication Sig Taking? Authorizing Provider   busPIRone (BUSPAR) 10 MG tablet Take 1 tablet by mouth 2 times daily Yes [provider]   omeprazole (PRILOSEC) 20 MG delayed release capsule Take 1 capsule by mouth daily Yes [provider]   atorvastatin (LIPITOR) 10 MG tablet Take 1 tablet by mouth daily Yes [provider]   FLUoxetine (PROZAC) 10 MG capsule Take 1 capsule by mouth daily Yes [provider]   lamoTRIgine (LAMICTAL) 150 MG tablet Take 1 tablet by mouth daily Yes [provider]     Physical Exam:  BP 129/87   Pulse 87   Temp 97.9 F (36.6 C) (Oral)   Resp 20   Ht 1.753 m (5\' 9" )   Wt (!) 154.6 kg (340 lb 13.3 oz)   SpO2 94%   BMI 50.33 kg/m    Ideal body weight: Ideal body weight: 70.7 kg (155 lb 13.8 oz)  Adjusted ideal body weight: 104.3 kg (229 lb 13.6 oz)    General: Alert and oriented, no acute distress. On O2.  HEENT: b/l conjunctivitis  Neck: Supple, non-tender, no carotid bruits, no JVD, no lymphadenopathy.  Lungs: rales  Heart: Normal rate, regular rhythm, no murmur, gallop or edema.  Abdomen: Soft, non-tender, non-distended, normal bowel sounds, no masses.  Musculoskeletal: Normal range of motion and strength, no tenderness or swelling.  Skin: Skin is warm, dry and pink, no rashes or lesions.  Neurologic: Awake, alert, and oriented X3, CN II-XII intact.  Psychiatric: Cooperative, appropriate mood and affect.    ROS:  Cardiovascular: No Chest pain, palpitations, syncope  Gastrointestinal: No nausea, vomiting,  diarrhea  Genitourinary: No hematuria  Hema/Lymph: Negative for bruising tendency, swollen lymph glands  Endocrine: Negative for excessive thirst, excessive hunger  Integumentary: No rash, pruritus, abrasions  Neurologic: Alert & oriented X 4    Labs:  Cr 0.8  WBC 4  LFTs nl    Assessment:  COVID19 syndrome    Plan:  Sx onset date: 12/25  O2 requirement: 3L  Continue dexamethasone day #2, CRP is 3.3  Start remdesivir, check LFTs daily while on this medication  CT chest neg  Isolation inpt 10d from Sx onset till 10/01/22    There is no role for ceftriaxone or doxycycline to Tx covid, stop them  There is no role for gentamicin or other eye Abx to treat viral conjunctivitis, stop it  Vitamin D and vitamin C have been studied with covid and they do not help, stop them.   Zinc does, though it has not yet made itself on to guidelines.

## 2022-09-25 NOTE — Care Coordination-Inpatient (Signed)
09/25/2022 AMB  CM spoke with pt's spouse Clarene Critchley via telephone to complete IA.  Clarene Critchley states that pt and family have recently moved to [facility] from [place]. Pt, spouse and their dtr are currently living with pt's mother and stepfather in a mobile home in [place], [place].   Pt previously IADLs with no prior HH/DME, however, his only insurance coverage is out of state Medicaid and so pt is effectively unfunded. Spouse states that they are working on updating pt's address with Social Security and then plan to apply for Medicaid here.  Pt does not have a PCP and if medications are needed would use [facility] in [place]     09/25/22 0905   Service Assessment   Patient Orientation Alert and Oriented   Cognition Alert   History Provided By [facility]   Primary Caregiver Self   Support Systems Spouse/Significant Other;Family Members;[place] is: Legal Next of Kin   PCP Verified by CM No   Prior Functional Level Independent in ADLs/IADLs   Can patient return to prior living arrangement Yes   Ability to make needs known: Good   Family able to assist with home care needs: Yes   Would you like for me to discuss the discharge plan with any other family members/significant others, and if so, who? Yes  (Spouse)   Financial Resources Medicaid  ([place])   Social/Functional History   Lives With Spouse;Daughter;Family   Type of Home Mobile home   Home Equipment None   ADL Assistance Independent   Ambulation Assistance Independent   Transfer Assistance Independent   Discharge Planning   Type of Residence Trailer/Mobile [place] Spouse/Significant Other;Children;Parent   Current Services Prior To Admission None   Patient expects to be discharged to: Trailer/mobile home   [place] Discharge   Transition of [place] (CM Consult) Discharge Planning   Mode of Transport at Discharge [place]

## 2022-09-26 LAB — HEPATIC FUNCTION PANEL
ALT: 41 U/L (ref 0–50)
AST: 34 U/L (ref 0–50)
Albumin: 3.4 g/dL — ABNORMAL LOW (ref 3.5–5.2)
Alk Phosphatase: 74 U/L (ref 40–130)
Bilirubin, Direct: <0.2 mg/dL (ref 0.00–0.30)
Total Bilirubin: 0.19 mg/dL (ref 0.00–1.20)
Total Protein: 6.9 g/dL (ref 6.4–8.3)

## 2022-09-26 LAB — PROCALCITONIN: Procalcitonin: 0.07 ng/mL (ref <=0.24)

## 2022-09-26 LAB — C-REACTIVE PROTEIN: CRP: 1.46 mg/dL — ABNORMAL HIGH (ref 0.00–0.50)

## 2022-09-26 MED FILL — FLUOXETINE HCL 10 MG PO CAPS: 10 MG | ORAL | Qty: 1

## 2022-09-26 MED FILL — VEKLURY 100 MG IV SOLR: 100 MG | INTRAVENOUS | Qty: 20

## 2022-09-26 MED FILL — ENOXAPARIN SODIUM 40 MG/0.4ML IJ SOSY: 40 MG/0.4ML | INTRAMUSCULAR | Qty: 0.4

## 2022-09-26 MED FILL — BUSPIRONE HCL 5 MG PO TABS: 5 MG | ORAL | Qty: 2

## 2022-09-26 MED FILL — MELATONIN 3 MG PO TABS: 3 MG | ORAL | Qty: 1

## 2022-09-26 MED FILL — ZINC GLUCONATE 50 MG PO TABS: 50 MG | ORAL | Qty: 1

## 2022-09-26 MED FILL — LAMOTRIGINE 100 MG PO TABS: 100 MG | ORAL | Qty: 2

## 2022-09-26 MED FILL — FAMOTIDINE 20 MG PO TABS: 20 MG | ORAL | Qty: 1

## 2022-09-26 MED FILL — ATORVASTATIN CALCIUM 10 MG PO TABS: 10 MG | ORAL | Qty: 1

## 2022-09-26 MED FILL — NORMAL SALINE FLUSH 0.9 % IV SOLN: 0.9 % | INTRAVENOUS | Qty: 10

## 2022-09-26 MED FILL — DEXAMETHASONE 6 MG PO TABS: 6 MG | ORAL | Qty: 1

## 2022-09-26 MED FILL — NORMAL SALINE FLUSH 0.9 % IV SOLN: 0.9 % | INTRAVENOUS | Qty: 40

## 2022-09-26 NOTE — Progress Notes (Signed)
Progress Note    Date:09/26/2022       ZOXW:9604/54  Patient Name:Richard Dennis     Date of Birth:[birth year]     Age:56 y.o.    Subjective   Pt sleeping, arousalable to voice. He feels better and hopefully to go home soon. He denies any sick contacts and had 3 COVID shots so unclear source of his COVID. He has no funding for O2 and discussed would need to figure this out or be off oxygen before we can DC. He is currently on 3L and between 89-93% sats. He admits he snores but never had a sleep study or seen a Pulmonologist. He has used rescue inhalers only for his asthma, no LABA hx.      Medications   Scheduled Meds:    dexAMETHasone  6 mg Oral Daily    enoxaparin  40 mg SubCUTAneous BID    remdesivir 100 mg in sodium chloride 0.9 % 250 mL IVPB  100 mg IntraVENous Q24H    famotidine  20 mg Oral BID    zinc gluconate  50 mg Oral Daily    atorvastatin  10 mg Oral Nightly    busPIRone  10 mg Oral BID    FLUoxetine  10 mg Oral Daily    lamoTRIgine  150 mg Oral Daily    sodium chloride flush  5-40 mL IntraVENous 2 times per day    melatonin  3 mg Oral Nightly       PRN Meds: ipratropium 0.5 mg-albuterol 2.5 mg, albuterol sulfate HFA, sodium chloride flush, sodium chloride, potassium chloride **OR** potassium alternative oral replacement **OR** potassium chloride, magnesium sulfate, ondansetron **OR** ondansetron, polyethylene glycol, acetaminophen **OR** acetaminophen    Physical Examination      Vitals:  BP 128/89   Pulse 75   Temp 98 F (36.7 C) (Oral)   Resp 22   Ht 1.753 m (5\' 9" )   Wt (!) 153 kg (337 lb 4.9 oz)   SpO2 94%   BMI 49.81 kg/m   Temp (24hrs), Avg:97.7 F (36.5 C), Min:97.4 F (36.3 C), Max:98 F (36.7 C)      I/O (24Hr):    Intake/Output Summary (Last 24 hours) at 09/26/2022 1317  Last data filed at 09/26/2022 1159  Gross per 24 hour   Intake 2788.33 ml   Output 625 ml   Net 2163.33 ml     General: NAD, pleasant, the obese  HEENT: PERRLA, mild conjunctivitis noted, no carotid bruits, JVD, or  lymphadenopathy, noted rash over the face  Lungs: mild dyspnea with conversation, on 3 LNC, mild expiratory wheezing in posterior lungs, clear otherwise  Heart: Normal rate, regular rhythm, no murmur  Abdomen: Soft, non-tender, non-distended, BS x x4  Musculoskeletal: Normal range of motion, MAE  Extremities: warm and dry, no edema noted, +2 DP pulses bilaterally  Neurologic: Awake, alert, and oriented X 3, CN grossly intact   Psychiatric: Cooperative, appropriate mood and affect    Labs/Imaging/Diagnostics   Labs:  CBC:  Recent Labs     09/24/22  1255 09/25/22  0640   WBC 5.9 5.8   RBC 5.25 5.25   HGB 15.9 16.0   HCT 53.1* 53.6*   MCV 101.1* 102.1*   RDW 13.8 13.8   PLT 133* 152     CHEMISTRIES:  Recent Labs     09/24/22  1313 09/25/22  0640   NA 143 138   K 4.4 5.3  5.3   CL 101 100   CO2 36*  30*   BUN 10 11   CREATININE 1.0 0.8   GLUCOSE 105* 156*   PHOS  --  2.5     LIVER PROFILE:  Recent Labs     09/24/22  1313 09/25/22  0640 09/26/22  0521   AST 39 44 34   ALT 41 45 41   BILIDIR  --   --  <0.20   BILITOT 0.29 0.25 0.19   ALKPHOS 78 75 74   Imaging Last 24 Hours:  CTA Chest W/WO Contrast PE Eval    Result Date: 09/24/2022  CTA chest: 09/24/22 INDICATION: Concern for PE R09.02,Hypoxemia,ICD-10-CM COMPARISON: Chest radiograph 09/24/2022 TECHNIQUE: PE protocol (Postcontrast imaging from the thoracic inlet through the  hemidiaphragms in the pulmonary arterial phase. Axial 5x5 mm soft tissue and lung 2x2 mm images. Multiplanar 3-D volumetric MIP reconstructions through the pulmonary arteries per protocol.) CT scanning was performed using radiation dose  reduction techniques when appropriate, per system protocols. Pulmonary arteries: No evidence of a pulmonary embolus with diagnostic confidence to the level the segmental arteries. Lungs/Airways: No suspicious nodules or consolidations. The airways are patent. Pleura: No pleural effusion or pneumothorax. Lymph Nodes: No mediastinal, hilar, or axillary adenopathy.  Cardiovascular: Normal heart size. No pericardial effusion. Osseous structures: No suspicious lytic or blastic osseous lesions. Partially visualized ACDF hardware. Upper Abdomen: Hepatic steatosis. Cholecystectomy.     No evidence of pulmonary embolus. No focal consolidations.    XR CHEST PORTABLE    Result Date: 09/24/2022  PORTABLE AP VIEW OF THE CHEST DATE: 09/24/22.  INDICATION:Shortness of Breath. COMPARISON:  None. NUMBER OF RADIOGRAPHIC IMAGES: 1 FINDINGS:  Heart and mediastinal contours are accentuated by shallow lung volumes. Lung detail is also limited by shallow lung volumes but no definite consolidation is seen. No evidence of pneumothorax or acute osseous abnormality.     Detail limited by shallow lung inflation but otherwise no acute abnormality is seen.     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 *     Plan:        COVID-19 with hypoxic respiratory failure  - Onset of symptoms 12/26 with boice changes and "not feeling right"  - CXR only shallow lung volumes. WBC WNL. Isolation precautions until 1/4  - 3L NC oxygen, wean as tolerated for SaO2 >92%  - IV Decadron 4 mg daily, day 2, CRP 3.3 to 1.6 and improved  - Procal neg, Rocephin and doxycycline stopped as no PNA noted   - ID following, apprec recs, Remdesivir started with co-morbidities, day 2/5  - Encourage continuous pulmonary toileting with IS and Flutter valve    Asthma   - Continue albuterol and duonebs PRN     Morbidly Obese, BMI >49  - Complicates all aspects of care  - Suspect OSA given girth and snores, rec OP sleep study to see CPAP needs    Anxiety   - Continue home BuSpar and fluoxetine     Conjunctivitis, viral   - no need for gentamicin eyedrops, DC     Tenia Barbarae   - Cont hydrocortisone and clotrimazole  cream    Dyslipidemia   - Continue home Lipitor 10 mg  Hyperglycemia  - BS up to 150's, likely secondary to steroids as above  -Will screen for diabetes given other comorbidities including morbid obesity and dyslipidemia with A1c    ADULT DIET; Regular; Low Sodium (2 gm)  DVT prophylaxis: Lovenox SQ  Dispo: pending clinical course and O2 needs given on insurance in [facility] yet  Code status: Full Code   POC/NOK:wife, declines for me to update   Medical Decision maker: patient   PCP: No primary care provider on file.     This note was created using voice recognition software and may contain typographic errors missed during final review. The intent is to have a complete and accurate medical record.  As a valued partner in this safety effort, if you have noted factual errors, please complete the Health Information Amendment/Correct Form or call the [facility] Health Information Management Office at [phone].    Silas Sacramento, AGNP-BC  [facility] Hospitalist Service  1:17 PM, 09/26/22     For any questions, please contact Team A

## 2022-09-26 NOTE — Progress Notes (Signed)
On 2L O2, CRP down    Physical Exam:  BP 127/82   Pulse 70   Temp 97.6 F (36.4 C) (Oral)   Resp 20   Ht 1.753 m (5\' 9" )   Wt (!) 153 kg (337 lb 4.9 oz)   SpO2 92%   BMI 49.81 kg/m    Ideal body weight: Ideal body weight: 70.7 kg (155 lb 13.8 oz)  Adjusted ideal body weight: 103.6 kg (228 lb 7.1 oz)    General: Alert and oriented, no acute distress. On O2.  HEENT: b/l conjunctivitis  Neck: Supple, non-tender, no carotid bruits, no JVD, no lymphadenopathy.  Lungs: rales  Heart: Normal rate, regular rhythm, no murmur, gallop or edema.  Abdomen: Soft, non-tender, non-distended, normal bowel sounds, no masses.  Musculoskeletal: Normal range of motion and strength, no tenderness or swelling.  Skin: some periorbital erythema - watching  Neurologic: Awake, alert, and oriented X3, CN II-XII intact.  Psychiatric: Cooperative, appropriate mood and affect.    ROS:  Cardiovascular: No Chest pain, palpitations, syncope  Gastrointestinal: No nausea, vomiting, diarrhea  Genitourinary: No hematuria  Hema/Lymph: Negative for bruising tendency, swollen lymph glands  Endocrine: Negative for excessive thirst, excessive hunger  Integumentary: No rash, pruritus, abrasions  Neurologic: Alert & oriented X 4    Labs:  LFTs nl    Assessment:  COVID19 syndrome    Plan:  Sx onset date: 12/25  Continue dexamethasone day #2, CRP is 1.5  Continue remdesivir day 2, LFTs nl today, no bradycardia  CT chest neg  Isolation inpt 10d from Sx onset till 10/01/22

## 2022-09-27 LAB — HEMOGLOBIN A1C
Est. Avg. Glucose, WB: 134
Est. Avg. Glucose-calculated: 147
Hemoglobin A1C: 6.3 % — ABNORMAL HIGH (ref 4.0–6.0)

## 2022-09-27 LAB — C-REACTIVE PROTEIN: CRP: 0.66 mg/dL — ABNORMAL HIGH (ref 0.00–0.50)

## 2022-09-27 LAB — HEPATIC FUNCTION PANEL
ALT: 37 U/L (ref 0–50)
AST: 30 U/L (ref 0–50)
Albumin: 3.4 g/dL — ABNORMAL LOW (ref 3.5–5.2)
Alk Phosphatase: 66 U/L (ref 40–130)
Bilirubin, Direct: <0.2 mg/dL (ref 0.00–0.30)
Total Bilirubin: 0.29 mg/dL (ref 0.00–1.20)
Total Protein: 6.8 g/dL (ref 6.4–8.3)

## 2022-09-27 MED FILL — NORMAL SALINE FLUSH 0.9 % IV SOLN: 0.9 % | INTRAVENOUS | Qty: 10

## 2022-09-27 MED FILL — LAMOTRIGINE 100 MG PO TABS: 100 MG | ORAL | Qty: 2

## 2022-09-27 MED FILL — FLUOXETINE HCL 10 MG PO CAPS: 10 MG | ORAL | Qty: 1

## 2022-09-27 MED FILL — FAMOTIDINE 20 MG PO TABS: 20 MG | ORAL | Qty: 1

## 2022-09-27 MED FILL — DEXAMETHASONE 6 MG PO TABS: 6 MG | ORAL | Qty: 1

## 2022-09-27 MED FILL — ENOXAPARIN SODIUM 40 MG/0.4ML IJ SOSY: 40 MG/0.4ML | INTRAMUSCULAR | Qty: 0.4

## 2022-09-27 MED FILL — MELATONIN 3 MG PO TABS: 3 MG | ORAL | Qty: 1

## 2022-09-27 MED FILL — VEKLURY 100 MG IV SOLR: 100 MG | INTRAVENOUS | Qty: 20

## 2022-09-27 MED FILL — TYLENOL 325 MG PO TABS: 325 MG | ORAL | Qty: 2

## 2022-09-27 MED FILL — ZINC GLUCONATE 50 MG PO TABS: 50 MG | ORAL | Qty: 1

## 2022-09-27 MED FILL — BUSPIRONE HCL 5 MG PO TABS: 5 MG | ORAL | Qty: 2

## 2022-09-27 MED FILL — ATORVASTATIN CALCIUM 10 MG PO TABS: 10 MG | ORAL | Qty: 1

## 2022-09-27 NOTE — Progress Notes (Signed)
Progress Note    Date:09/27/2022       Room:0626/01  Patient Name:Richard Dennis     Date of Birth:Oct 15, 1965     Age:56 y.o.    Subjective   Patient is seen resting in bed.  He is on a little bit less oxygen at 2 L.  He feels his breathing is better.  He is rash on his face appears about the same.  Case discussed with ID who is not making any changes to current medications.  Given he has no insurance here in [place] will need to be on room air before he can go home.  CRP is almost normal.  A1c was 6.3.  He thinks he remembers being told he is prediabetic wants.  Discussed low-carb diet and weight loss.    Medications   Scheduled Meds:    dexAMETHasone  6 mg Oral Daily    enoxaparin  40 mg SubCUTAneous BID    remdesivir 100 mg in sodium chloride 0.9 % 250 mL IVPB  100 mg IntraVENous Q24H    famotidine  20 mg Oral BID    zinc gluconate  50 mg Oral Daily    atorvastatin  10 mg Oral Nightly    busPIRone  10 mg Oral BID    FLUoxetine  10 mg Oral Daily    lamoTRIgine  150 mg Oral Daily    sodium chloride flush  5-40 mL IntraVENous 2 times per day    melatonin  3 mg Oral Nightly       PRN Meds: ipratropium 0.5 mg-albuterol 2.5 mg, albuterol sulfate HFA, sodium chloride flush, sodium chloride, potassium chloride **OR** potassium alternative oral replacement **OR** potassium chloride, magnesium sulfate, ondansetron **OR** ondansetron, polyethylene glycol, acetaminophen **OR** acetaminophen    Physical Examination      Vitals:  BP 122/89   Pulse 65   Temp 98.1 F (36.7 C) (Oral)   Resp 16   Ht 1.753 m (5\' 9" )   Wt (!) 160.3 kg (353 lb 6.4 oz)   SpO2 96%   BMI 52.19 kg/m   Temp (24hrs), Avg:98 F (36.7 C), Min:97.9 F (36.6 C), Max:98.4 F (36.9 C)      I/O (24Hr):    Intake/Output Summary (Last 24 hours) at 09/27/2022 1616  Last data filed at 09/27/2022 1321  Gross per 24 hour   Intake --   Output 950 ml   Net -950 ml     General: NAD, pleasant, morbidly obese  HEEN: noted rash over the face  Lungs: Clear to  auscultation, diminished in bases, on 2 LNC, no wheezing  Heart: Normal rate, regular rhythm, no murmur  Abdomen: Soft, non-tender, non-distended, BS x x4  Musculoskeletal: Normal range of motion, MAE  Extremities: warm and dry, no edema noted, +2 DP pulses bilaterally  Neurologic: Awake, alert, and oriented X 3  Psychiatric: Cooperative, appropriate mood and affect    Labs/Imaging/Diagnostics   Labs:  CBC:  Recent Labs     09/25/22  0640   WBC 5.8   RBC 5.25   HGB 16.0   HCT 53.6*   MCV 102.1*   RDW 13.8   PLT 152     CHEMISTRIES:  Recent Labs     09/25/22  0640   NA 138   K 5.3  5.3   CL 100   CO2 30*   BUN 11   CREATININE 0.8   GLUCOSE 156*   PHOS 2.5     LIVER PROFILE:  Recent Labs  09/25/22  0640 09/26/22  0521 09/27/22  0759   AST 44 34 30   ALT 45 41 37   BILIDIR  --  <0.20 <0.20   BILITOT 0.25 0.19 0.29   ALKPHOS 75 74 66   Imaging Last 24 Hours:  CTA Chest W/WO Contrast PE Eval    Result Date: 09/24/2022  CTA chest: 09/24/22 INDICATION: Concern for PE R09.02,Hypoxemia,ICD-10-CM COMPARISON: Chest radiograph 09/24/2022 TECHNIQUE: PE protocol (Postcontrast imaging from the thoracic inlet through the  hemidiaphragms in the pulmonary arterial phase. Axial 5x5 mm soft tissue and lung 2x2 mm images. Multiplanar 3-D volumetric MIP reconstructions through the pulmonary arteries per protocol.) CT scanning was performed using radiation dose  reduction techniques when appropriate, per system protocols. Pulmonary arteries: No evidence of a pulmonary embolus with diagnostic confidence to the level the segmental arteries. Lungs/Airways: No suspicious nodules or consolidations. The airways are patent. Pleura: No pleural effusion or pneumothorax. Lymph Nodes: No mediastinal, hilar, or axillary adenopathy. Cardiovascular: Normal heart size. No pericardial effusion. Osseous structures: No suspicious lytic or blastic osseous lesions. Partially visualized ACDF hardware. Upper Abdomen: Hepatic steatosis. Cholecystectomy.      No evidence of pulmonary embolus. No focal consolidations.    XR CHEST PORTABLE    Result Date: 09/24/2022  PORTABLE AP VIEW OF THE CHEST DATE: 09/24/22.  INDICATION:Shortness of Breath. COMPARISON:  None. NUMBER OF RADIOGRAPHIC IMAGES: 1 FINDINGS:  Heart and mediastinal contours are accentuated by shallow lung volumes. Lung detail is also limited by shallow lung volumes but no definite consolidation is seen. No evidence of pneumothorax or acute osseous abnormality.     Detail limited by shallow lung inflation but otherwise no acute abnormality is seen.     Assessment      Principal Problem:    COVID-19  Active Problems:    Hypoxia    Asthma    Tinea barbae    Obesity, morbid, BMI 40.0-49.9 ([place])    Dyslipidemia    Viral conjunctivitis    Anxiety    Prediabetes  Resolved Problems:    * No resolved hospital problems. *     Plan:        [id number] with hypoxic respiratory failure  - Onset of symptoms 12/26 with boice changes and "not feeling right"  - CXR only shallow lung volumes. WBC WNL. Isolation precautions until 1/4  - Down to 2L NC oxygen, wean as tolerated for SaO2 >92%  - IV Decadron 4 mg daily, day 2, CRP 3.3 to 1.6 to 0.66  - Procal neg, Rocephin and doxycycline stopped as no PNA noted   - ID following, apprec recs, Remdesivir started with co-morbidities, day 3/5  - Encourage continuous pulmonary toileting with IS and Flutter valve    Asthma   - Continue albuterol and duonebs PRN     Morbidly Obese, BMI >02  - Complicates all aspects of care  - Suspect OSA given girth and snores, rec OP sleep study to see CPAP needs    Anxiety   - Continue home BuSpar and fluoxetine     Conjunctivitis, viral   - no need for gentamicin eyedrops, DC     Tenia Barbarae   - Cont hydrocortisone and clotrimazole cream    Dyslipidemia   - Continue home Lipitor 10 mg    Prediabetes  - BS up to 150's, likely secondary to steroids as above  - A1c 6.3, Low carb diet, exercise and weight loss discussed     ADULT DIET; Regular; Low  Sodium (2 gm)  DVT prophylaxis: Lovenox SQ  Dispo: pending clinical course and O2  weaning, as on [place] insurance   Code status: Full Code   POC/NOK:wife, he is updating himself   Medical Decision maker: patient   PCP: No primary care provider on file. Needs To establish since moved here, [phone] will be given at DC    This note was created using voice recognition software and may contain typographic errors missed during final review. The intent is to have a complete and accurate medical record.  As a valued partner in this safety effort, if you have noted factual errors, please complete the Health Information Amendment/Correct Form or call the [facility] Health Information Management Office at [phone].    Silas Sacramento, AGNP-BC  [facility] Hospitalist Service  4:16 PM, 09/27/22     For any questions, please contact Team A

## 2022-09-27 NOTE — Progress Notes (Signed)
On 2L O2 but does not need to be, eyes back to nl, facial erythema gone. CRP down.     Physical Exam:  BP 128/82   Pulse 77   Temp 97.9 F (36.6 C) (Oral)   Resp 18   Ht 1.753 m (5\' 9" )   Wt (!) 160.3 kg (353 lb 6.4 oz)   SpO2 93%   BMI 52.19 kg/m    Ideal body weight: Ideal body weight: 70.7 kg (155 lb 13.8 oz)  Adjusted ideal body weight: 106.5 kg (234 lb 14 oz)    General: Alert and oriented, no acute distress. On O2.  HEENT: b/l conjunctivitis  Neck: Supple, non-tender, no carotid bruits, no JVD, no lymphadenopathy.  Lungs: rales  Heart: Normal rate, regular rhythm, no murmur, gallop or edema.  Abdomen: Soft, non-tender, non-distended, normal bowel sounds, no masses.  Musculoskeletal: Normal range of motion and strength, no tenderness or swelling.  Skin: some periorbital erythema - watching  Neurologic: Awake, alert, and oriented X3, CN II-XII intact.  Psychiatric: Cooperative, appropriate mood and affect.    ROS:  Cardiovascular: No Chest pain, palpitations, syncope  Gastrointestinal: No nausea, vomiting, diarrhea  Genitourinary: No hematuria  Hema/Lymph: Negative for bruising tendency, swollen lymph glands  Endocrine: Negative for excessive thirst, excessive hunger  Integumentary: No rash, pruritus, abrasions  Neurologic: Alert & oriented X 4    Labs:  LFTs nl    Assessment:  COVID19 syndrome    Plan:  Sx onset date: 12/25  Continue dexamethasone day #3, CRP is 0.7  Continue remdesivir day 3, LFTs nl today, no bradycardia  CT chest neg  Isolation inpt 10d from Sx onset till 10/01/22    Wean off the O2

## 2022-09-28 LAB — HEPATIC FUNCTION PANEL
ALT: 55 U/L — ABNORMAL HIGH (ref 0–50)
AST: 44 U/L (ref 0–50)
Albumin: 3.4 g/dL — ABNORMAL LOW (ref 3.5–5.2)
Alk Phosphatase: 65 U/L (ref 40–130)
Bilirubin, Direct: <0.2 mg/dL (ref 0.00–0.30)
Total Bilirubin: 0.45 mg/dL (ref 0.00–1.20)
Total Protein: 6.7 g/dL (ref 6.4–8.3)

## 2022-09-28 MED ORDER — LOPERAMIDE HCL 2 MG PO CAPS
2 MG | Freq: Four times a day (QID) | ORAL | Status: DC | PRN
Start: 2022-09-28 — End: 2022-09-30

## 2022-09-28 MED FILL — NORMAL SALINE FLUSH 0.9 % IV SOLN: 0.9 % | INTRAVENOUS | Qty: 10

## 2022-09-28 MED FILL — ENOXAPARIN SODIUM 40 MG/0.4ML IJ SOSY: 40 MG/0.4ML | INTRAMUSCULAR | Qty: 0.4

## 2022-09-28 MED FILL — FAMOTIDINE 20 MG PO TABS: 20 MG | ORAL | Qty: 1

## 2022-09-28 MED FILL — MELATONIN 3 MG PO TABS: 3 MG | ORAL | Qty: 1

## 2022-09-28 MED FILL — ATORVASTATIN CALCIUM 10 MG PO TABS: 10 MG | ORAL | Qty: 1

## 2022-09-28 MED FILL — DEXAMETHASONE 6 MG PO TABS: 6 MG | ORAL | Qty: 1

## 2022-09-28 MED FILL — FLUOXETINE HCL 10 MG PO CAPS: 10 MG | ORAL | Qty: 1

## 2022-09-28 MED FILL — BUSPIRONE HCL 5 MG PO TABS: 5 MG | ORAL | Qty: 2

## 2022-09-28 MED FILL — ZINC GLUCONATE 50 MG PO TABS: 50 MG | ORAL | Qty: 1

## 2022-09-28 MED FILL — LAMOTRIGINE 100 MG PO TABS: 100 MG | ORAL | Qty: 2

## 2022-09-28 MED FILL — VEKLURY 100 MG IV SOLR: 100 MG | INTRAVENOUS | Qty: 20

## 2022-09-28 NOTE — Progress Notes (Signed)
On 3L O2, no new complaints    Physical Exam:  BP (!) 142/94   Pulse 70   Temp 97.3 F (36.3 C) (Oral)   Resp 18   Ht 1.753 m (5\' 9" )   Wt (!) 149 kg (328 lb 7.8 oz)   SpO2 95%   BMI 48.51 kg/m    Ideal body weight: Ideal body weight: 70.7 kg (155 lb 13.8 oz)  Adjusted ideal body weight: 102 kg (224 lb 14.6 oz)    General: Alert and oriented, no acute distress. On O2.  HEENT: b/l conjunctivitis  Neck: Supple, non-tender, no carotid bruits, no JVD, no lymphadenopathy.  Lungs: rales  Heart: Normal rate, regular rhythm, no murmur, gallop or edema.  Abdomen: Soft, non-tender, non-distended, normal bowel sounds, no masses.  Musculoskeletal: Normal range of motion and strength, no tenderness or swelling.  Skin: some periorbital erythema - watching  Neurologic: Awake, alert, and oriented X3, CN II-XII intact.  Psychiatric: Cooperative, appropriate mood and affect.    ROS:  Cardiovascular: No Chest pain, palpitations, syncope  Gastrointestinal: No nausea, vomiting, diarrhea  Genitourinary: No hematuria  Hema/Lymph: Negative for bruising tendency, swollen lymph glands  Endocrine: Negative for excessive thirst, excessive hunger  Integumentary: No rash, pruritus, abrasions  Neurologic: Alert & oriented X 4    Labs:  LFTs nl    Assessment:  COVID19 syndrome    Plan:  Sx onset date: 12/25  Continue dexamethasone day #4, CRP is 0.7  Continue remdesivir day 4, LFTs ordered, no bradycardia  CT chest neg  Isolation inpt 10d from Sx onset till 10/01/22    Wean off the O2

## 2022-09-28 NOTE — Progress Notes (Signed)
Progress Note    Date:09/28/2022       Room:0626/01  Patient Name:Richard Dennis     Date of Birth:Apr 16, 1966     Age:57 y.o.    Subjective   Patient is seen resting in bed.  He is 93% on 2 L and have turned down to 1 L.  He states his breathing is better.  He does not feel his eyes are is itchy and he has not been scratching his face he says.  He did have some watery diarrhea for 3 to 4 days prior to coming into the hospital but states that is not foul-smelling and he has not been on any recent antibiotics only the remdesivir when he came in.  Will have RN check on next stool and see if need to send for C. difficile.  Discussed need to wean him off oxygen and he notes he did get to the chair yesterday but needs to walk around more in the room.  He continues to work with his flutter valve and I-S.    Medications   Scheduled Meds:    dexAMETHasone  6 mg Oral Daily    enoxaparin  40 mg SubCUTAneous BID    remdesivir 100 mg in sodium chloride 0.9 % 250 mL IVPB  100 mg IntraVENous Q24H    famotidine  20 mg Oral BID    zinc gluconate  50 mg Oral Daily    atorvastatin  10 mg Oral Nightly    busPIRone  10 mg Oral BID    FLUoxetine  10 mg Oral Daily    lamoTRIgine  150 mg Oral Daily    sodium chloride flush  5-40 mL IntraVENous 2 times per day    melatonin  3 mg Oral Nightly       PRN Meds: ipratropium 0.5 mg-albuterol 2.5 mg, albuterol sulfate HFA, sodium chloride flush, sodium chloride, potassium chloride **OR** potassium alternative oral replacement **OR** potassium chloride, magnesium sulfate, ondansetron **OR** ondansetron, polyethylene glycol, acetaminophen **OR** acetaminophen    Physical Examination      Vitals:  BP (!) 142/94   Pulse 70   Temp 97.3 F (36.3 C) (Oral)   Resp 18   Ht 1.753 m (5\' 9" )   Wt (!) 149 kg (328 lb 7.8 oz)   SpO2 95%   BMI 48.51 kg/m   Temp (24hrs), Avg:97.9 F (36.6 C), Min:97.3 F (36.3 C), Max:98.6 F (37 C)      I/O (24Hr):    Intake/Output Summary (Last 24 hours) at 09/28/2022  1122  Last data filed at 09/28/2022 0042  Gross per 24 hour   Intake --   Output 1250 ml   Net -1250 ml       General: NAD, pleasant, morbidly obese  HEEN: noted rash over the face, with B/L conjunctivitis  Lungs: Clear to auscultation, diminished in bases, on 1-2 LNC, no wheezing  Heart: Normal rate, regular rhythm, no murmur  Abdomen: Soft, non-tender, non-distended, BS x x4  Musculoskeletal: Normal range of motion, MAE  Extremities: warm and dry, no edema noted, +2 DP pulses bilaterally  Neurologic: Awake, alert, and oriented X 3  Psychiatric: Cooperative, appropriate mood and affect    Labs/Imaging/Diagnostics   Labs:    LIVER PROFILE:  Recent Labs     09/26/22  0521 09/27/22  0759   AST 34 30   ALT 41 37   BILIDIR <0.20 <0.20   BILITOT 0.19 0.29   ALKPHOS 74 66     Imaging Last  24 Hours:  CTA Chest W/WO Contrast PE Eval    Result Date: 09/24/2022  CTA chest: 09/24/22 INDICATION: Concern for PE R09.02,Hypoxemia,ICD-10-CM COMPARISON: Chest radiograph 09/24/2022 TECHNIQUE: PE protocol (Postcontrast imaging from the thoracic inlet through the  hemidiaphragms in the pulmonary arterial phase. Axial 5x5 mm soft tissue and lung 2x2 mm images. Multiplanar 3-D volumetric MIP reconstructions through the pulmonary arteries per protocol.) CT scanning was performed using radiation dose  reduction techniques when appropriate, per system protocols. Pulmonary arteries: No evidence of a pulmonary embolus with diagnostic confidence to the level the segmental arteries. Lungs/Airways: No suspicious nodules or consolidations. The airways are patent. Pleura: No pleural effusion or pneumothorax. Lymph Nodes: No mediastinal, hilar, or axillary adenopathy. Cardiovascular: Normal heart size. No pericardial effusion. Osseous structures: No suspicious lytic or blastic osseous lesions. Partially visualized ACDF hardware. Upper Abdomen: Hepatic steatosis. Cholecystectomy.     No evidence of pulmonary embolus. No focal consolidations.    XR  CHEST PORTABLE    Result Date: 09/24/2022  PORTABLE AP VIEW OF THE CHEST DATE: 09/24/22.  INDICATION:Shortness of Breath. COMPARISON:  None. NUMBER OF RADIOGRAPHIC IMAGES: 1 FINDINGS:  Heart and mediastinal contours are accentuated by shallow lung volumes. Lung detail is also limited by shallow lung volumes but no definite consolidation is seen. No evidence of pneumothorax or acute osseous abnormality.     Detail limited by shallow lung inflation but otherwise no acute abnormality is seen.     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 *     Plan:        [id number] with hypoxic respiratory failure  - Onset of symptoms 12/26 with boice changes and "not feeling right"  - CXR only shallow lung volumes. WBC WNL. Isolation precautions until 1/4  - Down to 1L NC oxygen this, wean as tolerated for SaO2 >92%, encourage mobility  - IV Decadron 4 mg daily, day 2, CRP 3.3 to 1.6 to 0.66  - Procal neg, Rocephin and doxycycline stopped as no PNA noted   - ID following, apprec recs, Remdesivir started with co-morbidities, day 4  - Encourage continuous pulmonary toileting with IS and Flutter valve    Asthma   - Continue albuterol and duonebs PRN     Morbidly Obese, BMI >08  - Complicates all aspects of care  - Suspect OSA given girth and snores, rec OP sleep study to see CPAP needs    Anxiety   - Continue home BuSpar and fluoxetine     Conjunctivitis, viral   - no need for gentamicin eyedrops, DC     Tenia Barbarae   - Cont hydrocortisone and clotrimazole cream    Dyslipidemia   - Continue home Lipitor 10 mg    Prediabetes  - BS up to 150's, likely secondary to steroids as above  - A1c 6.3, Low carb diet, exercise and weight loss discussed     ADULT DIET; Regular; Low Sodium (2 gm)  DVT prophylaxis: Lovenox  SQ  Dispo: pending clinical course and O2  weaning, as on [place] insurance   Code status: Full Code   POC/NOK: wife, he is updating himself   Medical Decision maker: patient  PCP: No primary care provider on file. Needs To establish since moved here, [phone] will be given at DC    This note was created using voice recognition software and may contain typographic errors missed during final review. The intent is to have a complete and accurate medical record.  As a valued partner in this safety effort, if you have noted factual errors, please complete the Health Information Amendment/Correct Form or call the [facility] Health Information Management Office at [phone].    Silas Sacramento, AGNP-BC  [facility] Hospitalist Service  11:22 AM, 09/28/22     For any questions, please contact Team A

## 2022-09-29 LAB — BASIC METABOLIC PANEL W/ REFLEX TO MG FOR LOW K
Anion Gap: 8 mmol/L (ref 2–17)
BUN: 19 mg/dL (ref 6–20)
CO2: 33 mmol/L — ABNORMAL HIGH (ref 22–29)
Calcium: 8.1 mg/dL — ABNORMAL LOW (ref 8.6–10.0)
Chloride: 100 mmol/L (ref 98–107)
Creatinine: 0.7 mg/dL (ref 0.7–1.3)
Est, Glom Filt Rate: 110 mL/min/1.73m (ref >=60)
Glucose: 113 mg/dL — ABNORMAL HIGH (ref 70–99)
OSMOLALITY CALCULATED: 284 mOsm/kg (ref 270–287)
Potassium: 4.3 mmol/L (ref 3.5–5.3)
Sodium: 141 mmol/L (ref 135–145)

## 2022-09-29 LAB — CBC WITH AUTO DIFFERENTIAL
Absolute Baso #: 0 10*3/uL (ref 0.0–0.2)
Absolute Eos #: 0 10*3/uL (ref 0.0–0.5)
Absolute Lymph #: 1.3 10*3/uL (ref 1.0–3.2)
Absolute Mono #: 0.6 10*3/uL (ref 0.3–1.0)
Basophils %: 0.1 % (ref 0.0–2.0)
Eosinophils %: 0.1 % (ref 0.0–7.0)
Hematocrit: 50.8 % (ref 38.0–52.0)
Hemoglobin: 15.4 g/dL (ref 13.0–17.3)
Immature Grans (Abs): 0.04 10*3/uL (ref 0.00–0.06)
Immature Granulocytes: 0.4 % (ref 0.0–0.6)
Lymphocytes: 14.7 % — ABNORMAL LOW (ref 15.0–45.0)
MCH: 29.8 pg (ref 27.0–34.5)
MCHC: 30.3 g/dL — ABNORMAL LOW (ref 32.0–36.0)
MCV: 98.3 fL (ref 84.0–100.0)
MPV: 10.1 fL (ref 7.2–13.2)
Monocytes: 7.2 % (ref 4.0–12.0)
NRBC Absolute: 0 10*3/uL (ref 0.000–0.012)
NRBC Automated: 0 % (ref 0.0–0.2)
Neutrophils %: 77.5 % — ABNORMAL HIGH (ref 42.0–74.0)
Neutrophils Absolute: 6.9 10*3/uL (ref 1.6–7.3)
Platelets: 173 10*3/uL (ref 140–440)
RBC: 5.17 x10e6/mcL (ref 4.00–5.60)
RDW: 13.6 % (ref 11.0–16.0)
WBC: 8.9 10*3/uL (ref 3.8–10.6)

## 2022-09-29 LAB — HEPATIC FUNCTION PANEL
ALT: 71 U/L — ABNORMAL HIGH (ref 0–50)
AST: 48 U/L (ref 0–50)
Albumin: 3.3 g/dL — ABNORMAL LOW (ref 3.5–5.2)
Alk Phosphatase: 66 U/L (ref 40–130)
Bilirubin, Direct: <0.2 mg/dL (ref 0.00–0.30)
Bilirubin, Indirect: 0.26 mg/dL (ref 0.00–1.00)
Total Bilirubin: 0.46 mg/dL (ref 0.00–1.20)
Total Protein: 6.5 g/dL (ref 6.4–8.3)

## 2022-09-29 MED FILL — BUSPIRONE HCL 5 MG PO TABS: 5 MG | ORAL | Qty: 2

## 2022-09-29 MED FILL — ATORVASTATIN CALCIUM 10 MG PO TABS: 10 MG | ORAL | Qty: 1

## 2022-09-29 MED FILL — ZINC GLUCONATE 50 MG PO TABS: 50 MG | ORAL | Qty: 1

## 2022-09-29 MED FILL — ENOXAPARIN SODIUM 40 MG/0.4ML IJ SOSY: 40 MG/0.4ML | INTRAMUSCULAR | Qty: 0.4

## 2022-09-29 MED FILL — MELATONIN 3 MG PO TABS: 3 MG | ORAL | Qty: 1

## 2022-09-29 MED FILL — DEXAMETHASONE 6 MG PO TABS: 6 MG | ORAL | Qty: 1

## 2022-09-29 MED FILL — NORMAL SALINE FLUSH 0.9 % IV SOLN: 0.9 % | INTRAVENOUS | Qty: 20

## 2022-09-29 MED FILL — VEKLURY 100 MG IV SOLR: 100 MG | INTRAVENOUS | Qty: 20

## 2022-09-29 MED FILL — FLUOXETINE HCL 10 MG PO CAPS: 10 MG | ORAL | Qty: 1

## 2022-09-29 MED FILL — PEG 3350 17 G PO PACK: 17 g | ORAL | Qty: 1

## 2022-09-29 MED FILL — LAMOTRIGINE 100 MG PO TABS: 100 MG | ORAL | Qty: 2

## 2022-09-29 MED FILL — FAMOTIDINE 20 MG PO TABS: 20 MG | ORAL | Qty: 1

## 2022-09-29 NOTE — Progress Notes (Signed)
On 2L O2, no new complaints    Physical Exam:  BP 132/87   Pulse 69   Temp 98.2 F (36.8 C) (Oral)   Resp 18   Ht 1.753 m (5\' 9" )   Wt (!) 149 kg (328 lb 7.8 oz)   SpO2 95%   BMI 48.51 kg/m    Ideal body weight: Ideal body weight: 70.7 kg (155 lb 13.8 oz)  Adjusted ideal body weight: 102 kg (224 lb 14.6 oz)    General: Alert and oriented, no acute distress. On O2.  HEENT: b/l conjunctivitis  Neck: Supple, non-tender, no carotid bruits, no JVD, no lymphadenopathy.  Lungs: rales  Heart: Normal rate, regular rhythm, no murmur, gallop or edema.  Abdomen: Soft, non-tender, non-distended, normal bowel sounds, no masses.  Musculoskeletal: Normal range of motion and strength, no tenderness or swelling.  Skin: some periorbital erythema - watching  Neurologic: Awake, alert, and oriented X3, CN II-XII intact.  Psychiatric: Cooperative, appropriate mood and affect.    ROS:  Cardiovascular: No Chest pain, palpitations, syncope  Gastrointestinal: No nausea, vomiting, diarrhea  Genitourinary: No hematuria  Hema/Lymph: Negative for bruising tendency, swollen lymph glands  Endocrine: Negative for excessive thirst, excessive hunger  Integumentary: No rash, pruritus, abrasions  Neurologic: Alert & oriented X 4    Labs:  Cr 0.7  WBC 9  LFTs ALT 71    Assessment:  COVID19 syndrome    Plan:  Sx onset date: 12/25  Continue dexamethasone day #4, CRP is 0.7  Continue remdesivir day 5/5, no bradycardia  CT chest neg  Isolation inpt 10d from Sx onset till 10/01/22    Wean off the O2

## 2022-09-29 NOTE — Care Coordination-Inpatient (Addendum)
09/29/2022 AMB  Discussed pt in AM rounds. Pt on day 5 of remdesivir; currently on room air at rest. Walk test completed and pt at 87% on exertion and so will attempt again tomorrow in hopes pt can be weaned off O2 completely prior to discharge.    09/25/2022 AMB  CM spoke with pt's spouse Clarene Critchley via telephone to complete IA.  Clarene Critchley states that pt and family have recently moved to [facility] from [place]. Pt, spouse and their dtr are currently living with pt's mother and stepfather in a mobile home in [place], [place].   Pt previously IADLs with no prior HH/DME, however, his only insurance coverage is out of state Medicaid and so pt is effectively unfunded. Spouse states that they are working on updating pt's address with Social Security and then plan to apply for Medicaid here.  Pt does not have a PCP and if medications are needed would use [facility] in [place]

## 2022-09-29 NOTE — Progress Notes (Signed)
Progress Note    Date:09/29/2022       Room:0626/01  Patient Name:Richard Dennis     Date of Birth:July 28, 1966     Age:57 y.o.    Subjective     Patient reports he is feeling improved. Still with a cough. He says he is using IS and flutter valve. Walk test today with desaturation to 87%. Discussed possible DC tomorrow. Tobacco cessation 2 months ago.    Medications   Scheduled Meds:    dexAMETHasone  6 mg Oral Daily    enoxaparin  40 mg SubCUTAneous BID    famotidine  20 mg Oral BID    zinc gluconate  50 mg Oral Daily    atorvastatin  10 mg Oral Nightly    busPIRone  10 mg Oral BID    FLUoxetine  10 mg Oral Daily    lamoTRIgine  150 mg Oral Daily    sodium chloride flush  5-40 mL IntraVENous 2 times per day    melatonin  3 mg Oral Nightly     Continuous Infusions:    sodium chloride       PRN Meds: loperamide, ipratropium 0.5 mg-albuterol 2.5 mg, albuterol sulfate HFA, sodium chloride flush, sodium chloride, potassium chloride **OR** potassium alternative oral replacement **OR** potassium chloride, magnesium sulfate, ondansetron **OR** ondansetron, polyethylene glycol, acetaminophen **OR** acetaminophen    Physical Examination      Vitals:  BP 136/89   Pulse 63   Temp 97.8 F (36.6 C) (Oral)   Resp 20   Ht 1.753 m (5\' 9" )   Wt (!) 149 kg (328 lb 7.8 oz)   SpO2 94%   BMI 48.51 kg/m   Temp (24hrs), Avg:98 F (36.7 C), Min:97.7 F (36.5 C), Max:98.2 F (36.8 C)      I/O (24Hr):    Intake/Output Summary (Last 24 hours) at 09/29/2022 1744  Last data filed at 09/29/2022 1159  Gross per 24 hour   Intake --   Output 400 ml   Net -400 ml       General: morbidly obese, NAD, occasional congested cough.  Lungs: Clear to auscultation and percussion, non-labored respiration. 2L NC after ambulation 95%.  Heart: Normal rate, regular rhythm, no murmur, gallop or edema.  Abdomen: Soft, non-tender, non-distended, normal bowel sounds, no masses.  Mental Status: Alert and oriented to person, place and  time.    Labs/Imaging/Diagnostics   Labs:  CBC:  Recent Labs     09/29/22  0718   WBC 8.9   RBC 5.17   HGB 15.4   HCT 50.8   MCV 98.3   RDW 13.6   PLT 173     CHEMISTRIES:  Recent Labs     09/29/22  0718   NA 141   K 4.3   CL 100   CO2 33*   BUN 19   CREATININE 0.7   GLUCOSE 113*     PT/INR:No results for input(s): "PROTIME", "INR" in the last 72 hours.  APTT:No results for input(s): "APTT" in the last 72 hours.  LIVER PROFILE:  Recent Labs     09/27/22  0759 09/28/22  1111 09/29/22  0718   AST 30 44 48   ALT 37 55* 71*   BILIDIR <0.20 <0.20 <0.20   BILITOT 0.29 0.45 0.46   ALKPHOS 66 65 66       Imaging Last 24 Hours:  No results found.      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 WBC WNL. Isolation precautions until 1/4  - Down to RA today but desats to 87% with ambulation. Placed back on 2L NC and will wean, wean as tolerated for SaO2 >92%, encourage mobility. IS and flutter valve.  - IV Decadron 4 mg daily, day 5, CRP 3.3 to 1.6 to 0.66  - Procal neg, Rocephin and doxycycline stopped as no PNA noted   - ID following, apprec recs, Remdesivir started with co-morbidities, day 5  - Encourage continuous pulmonary toileting with IS and Flutter valve     Asthma   - Continue albuterol and duonebs PRN     Morbidly Obese, BMI >91  - Complicates all aspects of care  - Suspect OSA given girth and snores, rec OP sleep study to see CPAP needs     Anxiety   - Continue home BuSpar and fluoxetine     Conjunctivitis, viral   - no need for  gentamicin eyedrops, DC     Tenia Barbarae   - Cont hydrocortisone and clotrimazole cream     Dyslipidemia   - Continue home Lipitor 10 mg     Prediabetes  - BS up to 150's, likely secondary to steroids as above  - A1c 6.3, Low carb diet, exercise and weight loss discussed      ADULT DIET; Regular; Low Sodium (2 gm)  DVT prophylaxis: Lovenox SQ  Dispo: pending clinical course and O2  weaning, as on [place] insurance. Possible DC in 24 hrs pending weaning oxygen.  Code status: Full Code   POC/NOK: wife, he is updating himself   Medical Decision maker: patient   PCP: No primary care provider on file. Needs To establish since moved here, [phone] will be given at DC    Electronically signed by Mallie Darting, PA-C on 09/29/22 at 5:44 PM EST

## 2022-09-30 MED ORDER — ATORVASTATIN CALCIUM 10 MG PO TABS
10 MG | ORAL_TABLET | Freq: Every day | ORAL | 1 refills | Status: DC
Start: 2022-09-30 — End: 2022-11-24

## 2022-09-30 MED ORDER — LAMOTRIGINE 150 MG PO TABS
150 MG | ORAL_TABLET | Freq: Every day | ORAL | 1 refills | Status: AC
Start: 2022-09-30 — End: ?

## 2022-09-30 MED ORDER — OMEPRAZOLE 20 MG PO CPDR
20 MG | ORAL_CAPSULE | Freq: Every day | ORAL | 1 refills | Status: DC
Start: 2022-09-30 — End: 2022-11-24

## 2022-09-30 MED ORDER — ALBUTEROL SULFATE HFA 108 (90 BASE) MCG/ACT IN AERS
108 (90 Base) MCG/ACT | RESPIRATORY_TRACT | 0 refills | Status: AC | PRN
Start: 2022-09-30 — End: ?

## 2022-09-30 MED ORDER — FLUOXETINE HCL 10 MG PO CAPS
10 MG | ORAL_CAPSULE | Freq: Every day | ORAL | 1 refills | Status: AC
Start: 2022-09-30 — End: ?

## 2022-09-30 MED ORDER — DEXAMETHASONE 6 MG PO TABS
6 MG | ORAL_TABLET | Freq: Every day | ORAL | 0 refills | Status: AC
Start: 2022-09-30 — End: 2022-10-05

## 2022-09-30 MED ORDER — BUSPIRONE HCL 10 MG PO TABS
10 MG | ORAL_TABLET | Freq: Two times a day (BID) | ORAL | 1 refills | Status: DC
Start: 2022-09-30 — End: 2022-11-24

## 2022-09-30 MED FILL — BUSPIRONE HCL 5 MG PO TABS: 5 MG | ORAL | Qty: 2

## 2022-09-30 MED FILL — DEXAMETHASONE 6 MG PO TABS: 6 MG | ORAL | Qty: 1

## 2022-09-30 MED FILL — ATORVASTATIN CALCIUM 10 MG PO TABS: 10 MG | ORAL | Qty: 1

## 2022-09-30 MED FILL — ENOXAPARIN SODIUM 40 MG/0.4ML IJ SOSY: 40 MG/0.4ML | INTRAMUSCULAR | Qty: 0.4

## 2022-09-30 MED FILL — FAMOTIDINE 20 MG PO TABS: 20 MG | ORAL | Qty: 1

## 2022-09-30 MED FILL — ZINC GLUCONATE 50 MG PO TABS: 50 MG | ORAL | Qty: 1

## 2022-09-30 MED FILL — MELATONIN 3 MG PO TABS: 3 MG | ORAL | Qty: 1

## 2022-09-30 MED FILL — LAMOTRIGINE 100 MG PO TABS: 100 MG | ORAL | Qty: 2

## 2022-09-30 MED FILL — FLUOXETINE HCL 10 MG PO CAPS: 10 MG | ORAL | Qty: 1

## 2022-09-30 NOTE — Plan of Care (Signed)
Problem: Discharge Planning  Goal: Discharge to home or other facility with appropriate resources  09/30/2022 1401 by Charise Carwin, RN  Outcome: Adequate for Discharge  09/30/2022 [phone] by Horton Finer, RN  Outcome: Progressing  Flowsheets (Taken 09/29/2022 2155)  Discharge to home or other facility with appropriate resources:   Identify barriers to discharge with patient and caregiver   Identify discharge learning needs (meds, wound care, etc)   Refer to discharge planning if patient needs post-hospital services based on physician order or complex needs related to functional status, cognitive ability or social support system     Problem: Skin/Tissue Integrity  Goal: Absence of new skin breakdown  Description: 1.  Monitor for areas of redness and/or skin breakdown  2.  Assess vascular access sites hourly  3.  Every 4-6 hours minimum:  Change oxygen saturation probe site  4.  Every 4-6 hours:  If on nasal continuous positive airway pressure, respiratory therapy assess nares and determine need for appliance change or resting period.  09/30/2022 1401 by Charise Carwin, RN  Outcome: Adequate for Discharge  09/30/2022 [phone] by Horton Finer, RN  Outcome: Progressing     Problem: Safety - Adult  Goal: Free from fall injury  09/30/2022 1401 by Charise Carwin, RN  Outcome: Adequate for Discharge  09/30/2022 [phone] by Horton Finer, RN  Outcome: Progressing     Problem: ABCDS Injury Assessment  Goal: Absence of physical injury  09/30/2022 1401 by Charise Carwin, RN  Outcome: Adequate for Discharge  09/30/2022 [phone] by Horton Finer, RN  Outcome: Progressing     Problem: Infection - Adult  Goal: Absence of infection at discharge  09/30/2022 1401 by Charise Carwin, RN  Outcome: Adequate for Discharge  09/30/2022 [phone] by Horton Finer, RN  Outcome: Progressing  Flowsheets (Taken 09/29/2022 2155)  Absence of infection at discharge:   Assess and monitor for signs and symptoms of infection   Monitor lab/diagnostic results  Goal: Absence of infection  during hospitalization  09/30/2022 1401 by Charise Carwin, RN  Outcome: Adequate for Discharge  09/30/2022 [phone] by Horton Finer, RN  Outcome: Progressing  Flowsheets (Taken 09/29/2022 2155)  Absence of infection during hospitalization:   Assess and monitor for signs and symptoms of infection   Monitor lab/diagnostic results     Problem: Respiratory - Adult  Goal: Achieves optimal ventilation and oxygenation  09/30/2022 1401 by Charise Carwin, RN  Outcome: Adequate for Discharge  09/30/2022 [phone] by Horton Finer, RN  Outcome: Progressing  Flowsheets (Taken 09/29/2022 2155)  Achieves optimal ventilation and oxygenation:   Assess for changes in respiratory status   Position to facilitate oxygenation and minimize respiratory effort   Oxygen supplementation based on oxygen saturation or arterial blood gases     Problem: Gastrointestinal - Adult  Goal: Maintains or returns to baseline bowel function  09/30/2022 1401 by Charise Carwin, RN  Outcome: Adequate for Discharge  09/30/2022 [phone] by Horton Finer, RN  Outcome: Progressing  Flowsheets (Taken 09/29/2022 2155)  Maintains or returns to baseline bowel function:   Assess bowel function   Encourage oral fluids to ensure adequate hydration  Goal: Maintains adequate nutritional intake  09/30/2022 1401 by Charise Carwin, RN  Outcome: Adequate for Discharge  09/30/2022 [phone] by Horton Finer, RN  Outcome: Progressing     Problem: Neurosensory - Adult  Goal: Achieves stable or improved neurological status  09/30/2022 1401 by Charise Carwin, RN  Outcome: Adequate for Discharge  09/30/2022 [phone] by Horton Finer, RN  Outcome: Progressing  Flowsheets (  Taken 09/29/2022 2155)  Achieves stable or improved neurological status:   Assess for and report changes in neurological status   Maintain blood pressure and fluid volume within ordered parameters to optimize cerebral perfusion and minimize risk of hemorrhage  Goal: Achieves maximal functionality and self care  09/30/2022 1401 by Charise Carwin, RN  Outcome:  Adequate for Discharge  09/30/2022 [phone] by Horton Finer, RN  Outcome: Progressing

## 2022-09-30 NOTE — Discharge Instructions (Addendum)
Continue using incentive spirometer and flutter valve, as well as taking several walks around your home daily until you have fully recovered from [place]. This will help your lung function recover. Recommend to get a sleep study to rule out sleep apnea. Recheck your A1c in a few months. Your A1c is currently 6.3.

## 2022-09-30 NOTE — Progress Notes (Signed)
Infectious Diseases Progress Note      Subjective  Remains in isolation. Currently on 2 L NC O2. Less cough, dyspnea. Hoping to go home.    Medications:  Current Facility-Administered Medications   Medication Dose Route Frequency Provider Last Rate Last Admin    loperamide (IMODIUM) capsule 2 mg  2 mg Oral 4x Daily PRN Sigurd Sos, APRN - NP        ipratropium 0.5 mg-albuterol 2.5 mg (DUONEB) nebulizer solution 1 Dose  1 Dose Inhalation Q4H PRN Lovenia Shuck, APRN - NP   1 Dose at 09/25/22 1141    dexAMETHasone (DECADRON) tablet 6 mg  6 mg Oral Daily Lovenia Shuck, APRN - NP   6 mg at 09/29/22 2155    enoxaparin (LOVENOX) injection 40 mg  40 mg SubCUTAneous BID Lovenia Shuck, APRN - NP   40 mg at 09/30/22 0954    albuterol sulfate HFA (PROVENTIL;VENTOLIN;PROAIR) 108 (90 Base) MCG/ACT inhaler 2 puff  2 puff Inhalation Q4H PRN Jaclyn Shaggy, MD        famotidine (PEPCID) tablet 20 mg  20 mg Oral BID Jaclyn Shaggy, MD   20 mg at 09/30/22 1093    zinc gluconate tablet 50 mg  50 mg Oral Daily Jaclyn Shaggy, MD   50 mg at 09/30/22 0954    atorvastatin (LIPITOR) tablet 10 mg  10 mg Oral Nightly Jaclyn Shaggy, MD   10 mg at 09/29/22 2155    busPIRone (BUSPAR) tablet 10 mg  10 mg Oral BID Jaclyn Shaggy, MD   10 mg at 09/30/22 0954    FLUoxetine (PROZAC) capsule 10 mg  10 mg Oral Daily Jaclyn Shaggy, MD   10 mg at 09/30/22 0954    lamoTRIgine (LAMICTAL) tablet 150 mg  150 mg Oral Daily Jaclyn Shaggy, MD   150 mg at 09/30/22 0954    sodium chloride flush 0.9 % injection 5-40 mL  5-40 mL IntraVENous 2 times per day Jaclyn Shaggy, MD   10 mL at 09/30/22 0954    sodium chloride flush 0.9 % injection 5-40 mL  5-40 mL IntraVENous PRN Jaclyn Shaggy, MD        0.9 % sodium chloride infusion   IntraVENous PRN Jaclyn Shaggy, MD        potassium chloride (KLOR-CON M) extended release tablet 40 mEq  40 mEq Oral PRN Jaclyn Shaggy, MD         Or    potassium bicarb-citric acid (EFFER-K) effervescent tablet 40 mEq  40 mEq Oral PRN Jaclyn Shaggy, MD        Or    potassium chloride 10 mEq/100 mL IVPB (Peripheral Line)  10 mEq IntraVENous PRN Jaclyn Shaggy, MD        magnesium sulfate 2000 mg in water 50 mL IVPB  2,000 mg IntraVENous PRN Jaclyn Shaggy, MD        ondansetron (ZOFRAN-ODT) disintegrating tablet 4 mg  4 mg Oral Q8H PRN Jaclyn Shaggy, MD        Or    ondansetron [facility]) injection 4 mg  4 mg IntraVENous Q6H PRN Jaclyn Shaggy, MD        polyethylene glycol (GLYCOLAX) packet 17 g  17 g Oral Daily PRN Jaclyn Shaggy, MD   17 g at 09/29/22 1043    acetaminophen (TYLENOL) tablet 650 mg  650 mg Oral Q6H PRN Jaclyn Shaggy,  MD   650 mg at 09/26/22 2343    Or    acetaminophen (TYLENOL) suppository 650 mg  650 mg Rectal Q6H PRN Jaclyn Shaggy, MD        melatonin tablet 3 mg  3 mg Oral Nightly Jaclyn Shaggy, MD   3 mg at 09/29/22 2154      Review of Systems:  General: No fever, chills, sweats. Fatigue, malaise.  Neuro: No headache, confusion, dizziness.  Skin: No rash, itching, new skin lesions.  HEENT: No vision changes, eye pain, drainage. No hearing changes, ear pain, drainage. No sore throat, dysphagia. No sinus congestion. No oral or dental pain.  Neck: No neck pain, swelling, stiffness.  Chest: Less shortness of breath, cough.  Cardiac: No chest pain, palpitations, edema.  Abd: No abdominal pain, bloating, nausea, vomiting, diarrhea, constipation, blood in stools.  Ext: No new joint pain, muscle pain. No swelling.  GU: No dysuria, hematuria, pelvic pain.    Objective:  Physical Exam:  BP 120/89   Pulse 62   Temp 97.8 F (36.6 C) (Oral)   Resp 18   Ht 1.753 m (5\' 9" )   Wt (!) 147.8 kg (325 lb 13.4 oz)   SpO2 92%   BMI 48.12 kg/m   General: Resting comfortably in NAD.  Neuro: Awake, alert, oriented. No focal change or tremor.   Skin: No rashes. No open skin lesions. No bruising, petechiae,  purpura. No jaundice.  HEENT: Atraumatic. Oropharynx clear. Sclera anicteric.   Neck: No lymphadenopathy, masses, tenderness.  Chest: No masses or tenderness. Clear breath sounds anteriorly. Breathing unlabored.  Car: S1S2 regular. No murmur, rub, gallop.  Abd: Soft, non-tender, non-distended. Bowel sounds positive. No HSM or masses.  Ext: Mild peripheral edema. No joint swelling or tenderness. IV site clean, dry, dressed.    Lab Results:  Lab Results   Component Value Date/Time    WBC 8.9 09/29/2022 07:18 AM    RBC 5.17 09/29/2022 07:18 AM    HGB 15.4 09/29/2022 07:18 AM    HCT 50.8 09/29/2022 07:18 AM    PLT 173 09/29/2022 07:18 AM    MCV 98.3 09/29/2022 07:18 AM    LYMPHOPCT 14.7 09/29/2022 07:18 AM     Lab Results   Component Value Date/Time    NA 141 09/29/2022 07:18 AM    K 4.3 09/29/2022 07:18 AM    CL 100 09/29/2022 07:18 AM    CO2 33 09/29/2022 07:18 AM    BUN 19 09/29/2022 07:18 AM    CREATININE 0.7 09/29/2022 07:18 AM    CALCIUM 8.1 09/29/2022 07:18 AM    LABGLOM 110 09/29/2022 07:18 AM    GLUCOSE 113 09/29/2022 07:18 AM       Diagnostic Results:      Assessment/Plan:    COVID19.    Sx onset date: 12/25  Continue dexamethasone day #5, CRP declining  Completing 5 days of Remi  CT chest neg  Procal NL  Isolation inpt 10d from Sx onset till 10/01/22     Wean off the O2        Fransico Him, DO, MBA, [place], [place]

## 2022-09-30 NOTE — Discharge Summary (Signed)
[facility] Hospitalist Service  Discharge Summary     Patient ID:  Richard Dennis [id number] y.o. [birth year]    Admit date: 09/24/2022  Discharge date and time: 09/30/22 at 1:53 PM   Admitting Physician: Emeline Gins, MD   Discharge Physician: Eldridge Abrahams, PA-C   Admission Diagnoses: Hypoxia [R09.02]  Class 3 severe obesity with body mass index (BMI) of 50.0 to 59.9 in adult, unspecified obesity type, unspecified whether serious comorbidity present (HCC) [E66.01, Z68.43]  COVID-19 [U07.1]  Pneumonia due to COVID-19 virus [U07.1, J12.82]  Discharge Diagnoses:   Patient Active Problem List   Diagnosis    Asthma    Obesity, morbid, BMI 40.0-49.9 (HCC)    Dyslipidemia    Hyperglycemia    Prediabetes        Admission Condition: poor  Discharged Condition: good      Discharge Diagnoses   Principal Problem (Resolved):    COVID-19  Active Problems:    Asthma    Obesity, morbid, BMI 40.0-49.9 (HCC)    Dyslipidemia    Prediabetes  Resolved Problems:    Hypoxia    Tinea barbae    Viral conjunctivitis    Anxiety      Hospital Stay   Admission H&P:  Richard Dennis is a very pleasant 57 gentleman with a history of asthma, high BMI, anxiety, dyslipidemia presented to the emergency room at [facility] with complaints of worsening shortness of breath cough as well as positive COVID test at home and low oxygen level.  In the emergency room he was retested for COVID and was found to be positive and he was found to be hypoxic.  Internal medicine was consulted to admit him to the inpatient service under the hospitalist team at [facility] for further evaluation and if needed follow-up with infectious disease.     As per the patient he was doing good until about 2 to 3 days ago.  Past few days he is getting some discharge from the eyes as well as worsening weakness and body pain.  He checked his oxygen level it was found to be in this as well as he was found to have positive COVID  negative.  He got alerted and came to the emergency room for further evaluation.     No complaints of headache, vomiting, chest pain, pain in the abdomen bowel or bladder problems except for the cough body pain conjunctival discharge as well as a low oxygen level.    Hospital Course:     COVID-19 with hypoxic respiratory failure  - Onset of symptoms 12/26 with voice changes and "not feeling right"  - CXR only shallow lung volumes. WBC WNL. Isolation precautions until 1/4  - Down to RA and lowest was 90% with ambulation. Continue mobility, IS and flutter valve on discharge.  - Inflammatory markers downtrending. Decadron to complete 10 days - 5 more days on discharge.   - Procal neg, Rocephin and doxycycline stopped as no PNA noted   - ID followed. Completed 5 days of Remdesivir given co-morbidities.  -He has a pulse ox to monitor his oxygen.      Asthma   - Continue albuterol and duonebs PRN. Writing for albuterol inhaler on discharge.     Morbidly Obese, BMI >49  - Complicates all aspects of care  - Suspect OSA given girth and snores, rec OP sleep study to see CPAP needs.      Anxiety   - Continue  home BuSpar and fluoxetine     Conjunctivitis, viral   - no need for gentamicin eyedrops, DC     Tenia Barbarae   - Had hydrocortisone and clotrimazole cream. Resolved.     Dyslipidemia   - Continue home Lipitor 10 mg     Prediabetes  - BS up to 150's, likely secondary to steroids as above  - A1c 6.3, Low carb diet, exercise and weight loss discussed. Repeat A1c outpatient in a few months.       POC/NOK: wife, he is updating himself      Consults:  IP CONSULT TO INFECTIOUS DISEASES  Microbiology:  C diff negative  SARSCOV2 (+)    Recent Labs: CBC:   Lab Results   Component Value Date/Time    WBC 8.9 09/29/2022 07:18 AM    RBC 5.17 09/29/2022 07:18 AM    HGB 15.4 09/29/2022 07:18 AM    HCT 50.8 09/29/2022 07:18 AM    MCV 98.3 09/29/2022 07:18 AM    MCH 29.8 09/29/2022 07:18 AM    MCHC 30.3 09/29/2022 07:18 AM    RDW 13.6  09/29/2022 07:18 AM    PLT 173 09/29/2022 07:18 AM     BMP:    Lab Results   Component Value Date/Time    GLUCOSE 113 09/29/2022 07:18 AM    NA 141 09/29/2022 07:18 AM    K 4.3 09/29/2022 07:18 AM    CL 100 09/29/2022 07:18 AM    CO2 33 09/29/2022 07:18 AM    ANIONGAP 8 09/29/2022 07:18 AM    BUN 19 09/29/2022 07:18 AM    CREATININE 0.7 09/29/2022 07:18 AM    CALCIUM 8.1 09/29/2022 07:18 AM    LABGLOM 110 09/29/2022 07:18 AM     Radiology Last 7 Days:  CTA Chest W/WO Contrast PE Eval    Result Date: 09/24/2022  No evidence of pulmonary embolus. No focal consolidations.    XR CHEST PORTABLE    Result Date: 09/24/2022  Detail limited by shallow lung inflation but otherwise no acute abnormality is seen.           Discharge Exam:  BP 114/81   Pulse 71   Temp 97.4 F (36.3 C) (Oral)   Resp 18   Ht 1.753 m (5\' 9" )   Wt (!) 147.8 kg (325 lb 13.4 oz)   SpO2 93%   BMI 48.12 kg/m   General appearance: alert, appears stated age, and cooperative. Morbidly obese. NAD. Very pleasant.  Head: Normocephalic, without obvious abnormality, atraumatic  Lungs: clear to auscultation bilaterally  Heart: regular rate and rhythm, S1, S2 normal, no murmur, click, rub or gallop  Abdomen: soft, non-tender; bowel sounds normal; no masses,  no organomegaly  Extremities: extremities normal, atraumatic, no cyanosis or edema  Skin: Skin color, texture, turgor normal. No rashes or lesions  Neurologic: Grossly normal      Discharge Plan   Disposition: DISPOSITION Hospitalist: Home with PCP follow-up    Provider Follow-Up:   [place] [address]  [phone]  Go in 2 week(s)  January 16th at 9:45 am - primary care doctor           Patient Instructions     Current Discharge Medication List        START taking these medications    Details   albuterol sulfate HFA (PROVENTIL;VENTOLIN;PROAIR) 108 (90 Base) MCG/ACT inhaler Inhale 2 puffs into the lungs every  4 hours  as needed for Wheezing  Qty: 18 g, Refills: 0      dexAMETHasone (DECADRON) 6 MG tablet Take 1 tablet by mouth daily for 5 days  Qty: 5 tablet, Refills: 0           CONTINUE these medications which have CHANGED    Details   omeprazole (PRILOSEC) 20 MG delayed release capsule Take 1 capsule by mouth daily  Qty: 30 capsule, Refills: 1      atorvastatin (LIPITOR) 10 MG tablet Take 1 tablet by mouth daily  Qty: 30 tablet, Refills: 1      busPIRone (BUSPAR) 10 MG tablet Take 1 tablet by mouth 2 times daily  Qty: 60 tablet, Refills: 1      FLUoxetine (PROZAC) 10 MG capsule Take 1 capsule by mouth daily  Qty: 30 capsule, Refills: 1      lamoTRIgine (LAMICTAL) 150 MG tablet Take 1 tablet by mouth daily  Qty: 30 tablet, Refills: 1           Diet: Cardiac and low carb diet.  Activity: activity as tolerated    Discharge Medications         Medication List        START taking these medications      albuterol sulfate HFA 108 (90 Base) MCG/ACT inhaler  Commonly known as: PROVENTIL;VENTOLIN;PROAIR  Inhale 2 puffs into the lungs every 4 hours as needed for Wheezing     dexAMETHasone 6 MG tablet  Commonly known as: DECADRON  Take 1 tablet by mouth daily for 5 days            CONTINUE taking these medications      atorvastatin 10 MG tablet  Commonly known as: LIPITOR  Take 1 tablet by mouth daily     busPIRone 10 MG tablet  Commonly known as: BUSPAR  Take 1 tablet by mouth 2 times daily     FLUoxetine 10 MG capsule  Commonly known as: PROZAC  Take 1 capsule by mouth daily     lamoTRIgine 150 MG tablet  Commonly known as: LAMICTAL  Take 1 tablet by mouth daily     omeprazole 20 MG delayed release capsule  Commonly known as: PRILOSEC  Take 1 capsule by mouth daily               Where to Get Your Medications        These medications were sent to [id number] - [place], [place] - [address]  [place], [place] [address]      Phone: [phone]   albuterol sulfate  HFA 108 (90 Base) MCG/ACT inhaler  atorvastatin 10 MG tablet  busPIRone 10 MG tablet  dexAMETHasone 6 MG tablet  FLUoxetine 10 MG capsule  lamoTRIgine 150 MG tablet  omeprazole 20 MG delayed release capsule             Time Spent on Discharge:  40 minutes were spent in patient examination, evaluation, counseling as well as medication reconciliation, prescriptions for required medications, discharge plan and follow up.        Electronically signed by Mallie Darting, PA-C on 09/30/22 at 1:53 PM EST

## 2022-09-30 NOTE — Care Coordination-Inpatient (Signed)
09/30/2022 AMB  Discussed pt in AM rounds. Pt stable and appropriate for d/c home today. As pt is unfunded, pt scheduled for [facility] appt 10/13/22 arrival time 0945.   No further CM needs anticipated.     09/30/22 [place] Discharge   Transition of Care Consult (CM Consult) Discharge Planning   Services At/After Discharge Community Resources   Mode of Transport at Discharge Self   Confirm Follow Up Transport Family     09/29/2022 AMB  Discussed pt in AM rounds. Pt on day 5 of remdesivir; currently on room air at rest. Walk test completed and pt at 87% on exertion and so will attempt again tomorrow in hopes pt can be weaned off O2 completely prior to discharge.    09/25/2022 AMB  CM spoke with pt's spouse Clarene Critchley via telephone to complete IA.  Clarene Critchley states that pt and family have recently moved to [facility] from [place]. Pt, spouse and their dtr are currently living with pt's mother and stepfather in a mobile home in [place], [place].   Pt previously IADLs with no prior HH/DME, however, his only insurance coverage is out of state Medicaid and so pt is effectively unfunded. Spouse states that they are working on updating pt's address with Social Security and then plan to apply for Medicaid here.  Pt does not have a PCP and if medications are needed would use [facility] in [place]

## 2022-09-30 NOTE — Plan of Care (Signed)
Problem: Discharge Planning  Goal: Discharge to home or other facility with appropriate resources  Outcome: Progressing  Flowsheets (Taken 09/29/2022 2155)  Discharge to home or other facility with appropriate resources:   Identify barriers to discharge with patient and caregiver   Identify discharge learning needs (meds, wound care, etc)   Refer to discharge planning if patient needs post-hospital services based on physician order or complex needs related to functional status, cognitive ability or social support system     Problem: Skin/Tissue Integrity  Goal: Absence of new skin breakdown  Description: 1.  Monitor for areas of redness and/or skin breakdown  2.  Assess vascular access sites hourly  3.  Every 4-6 hours minimum:  Change oxygen saturation probe site  4.  Every 4-6 hours:  If on nasal continuous positive airway pressure, respiratory therapy assess nares and determine need for appliance change or resting period.  Outcome: Progressing     Problem: Safety - Adult  Goal: Free from fall injury  Outcome: Progressing     Problem: ABCDS Injury Assessment  Goal: Absence of physical injury  Outcome: Progressing     Problem: Infection - Adult  Goal: Absence of infection at discharge  Outcome: Progressing  Flowsheets (Taken 09/29/2022 2155)  Absence of infection at discharge:   Assess and monitor for signs and symptoms of infection   Monitor lab/diagnostic results  Goal: Absence of infection during hospitalization  Outcome: Progressing  Flowsheets (Taken 09/29/2022 2155)  Absence of infection during hospitalization:   Assess and monitor for signs and symptoms of infection   Monitor lab/diagnostic results     Problem: Respiratory - Adult  Goal: Achieves optimal ventilation and oxygenation  Outcome: Progressing  Flowsheets (Taken 09/29/2022 2155)  Achieves optimal ventilation and oxygenation:   Assess for changes in respiratory status   Position to facilitate oxygenation and minimize respiratory effort   Oxygen  supplementation based on oxygen saturation or arterial blood gases     Problem: Gastrointestinal - Adult  Goal: Maintains or returns to baseline bowel function  Outcome: Progressing  Flowsheets (Taken 09/29/2022 2155)  Maintains or returns to baseline bowel function:   Assess bowel function   Encourage oral fluids to ensure adequate hydration  Goal: Maintains adequate nutritional intake  Outcome: Progressing     Problem: Neurosensory - Adult  Goal: Achieves stable or improved neurological status  Outcome: Progressing  Flowsheets (Taken 09/29/2022 2155)  Achieves stable or improved neurological status:   Assess for and report changes in neurological status   Maintain blood pressure and fluid volume within ordered parameters to optimize cerebral perfusion and minimize risk of hemorrhage  Goal: Achieves maximal functionality and self care  Outcome: Progressing

## 2022-10-13 ENCOUNTER — Encounter

## 2022-10-22 ENCOUNTER — Encounter: Admit: 2022-10-22 | Discharge: 2022-10-22 | Payer: PRIVATE HEALTH INSURANCE

## 2022-10-22 MED ORDER — OLANZAPINE 15 MG PO TABS
15 | ORAL_TABLET | Freq: Every evening | ORAL | 1 refills | Status: AC
Start: 2022-10-22 — End: ?

## 2022-10-22 NOTE — Assessment & Plan Note (Signed)
Long history of GERD without esophagitis has been stable on omeprazole which she continues and has prescription.

## 2022-10-22 NOTE — Assessment & Plan Note (Signed)
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  He has been stable on current medication regimen.  This includes olanzapine, lamotrigine, buspirone and fluoxetine.  Denies any side effects on his current regimen.  Denies any symptoms of serotonin type syndrome.  He does have refills on each of those meds except for the olanzapine and that will be updated today at 15 mg nightly.  He will be contacting [facility] about getting established there with a behavioral health provider for ongoing care.

## 2022-10-22 NOTE — Progress Notes (Signed)
Richard Dennis (DOB:  May 05, 1966) is a 57 y.o. male, here for evaluation of the following chief complaint(s):  New Patient           ASSESSMENT/PLAN:  1. 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  Long-term issues regarding potential weight gain dietary issues and whether patient would be a candidate for interventional procedure and bariatric surgery should be discussed as well going forward.  Patient likely will be establishing with a new PCP since he will be on a new insurance plan this month.  2. Bipolar 1 disorder, depressed, partial remission (HCC)  Assessment & Plan:  Long history of bipolar disorder predominant depression although based on history likely had associated mania.  He has been stable on current medication regimen.  This includes olanzapine, lamotrigine, buspirone and fluoxetine.  Denies any side effects on his current regimen.  Denies any symptoms of serotonin type syndrome.  He does have refills on each of those meds except for the olanzapine and that will be updated today at 15 mg nightly.  He will be contacting [facility] about getting established there with a behavioral health provider for ongoing care.  Orders:  -     OLANZapine (ZYPREXA) 15 MG tablet; Take 1 tablet by mouth nightly, Disp-30 tablet, R-1Normal  3. Hypercholesteremia  Assessment & Plan:  Long history hypercholesterolemia has been on atorvastatin and continues on same without side effects.  Lipid profile in hospital was good.  4. Snoring  Assessment & Plan:  Long history of snoring.  Possible obstructive sleep apnea.  When he was hospitalized with hypoxia for from COVID they did discuss with patient getting a sleep study.  I have reviewed this with him today and would recommend a sleep study although he is not willing to schedule that at this time it would be important  going forward as well.  5. GERD without esophagitis  Assessment & Plan:   Long history of GERD without esophagitis has been stable on omeprazole which she continues and has prescription.  6. Impaired glucose tolerance  Assessment & Plan:   Patient developed hyperglycemia in the hospital although he was on steroids as well.  His A1c was at 6.3%.  This would be consistent with an impaired glucose tolerance/prediabetes.  This will need to be monitored.  Not currently on medication although this is also contributed from his obesity.  Ongoing management with weight management when he gets established with the new PCP.  7. S/P cervical spinal fusion  Assessment & Plan:   Previous lumbar fusion.  Limited range of motion but no radiculopathic symptoms current  8. S/P lumbar fusion  Assessment & Plan:   Previous lumbar fusion.  Limited range of motion but no radiculopathic symptoms current      Return in about 1 month (around 11/22/2022).       Subjective   SUBJECTIVE/OBJECTIVE:  Patient presents to establish as a new primary care patient.  Although apparently will be on insurance in the near future.  The patient was admitted to the hospital on 09/24/2022 with shortness of breath hypoxia.  Patient diagnosed with COVID.  Did not have pneumonia.  He was felt to have hypoventilation as well from the obesity and possibly obstructive sleep apnea although has never had a sleep study.  Patient does have loud snoring and that has been a chronic problem.  Patient had recently relocated from [place] to [place] with his wife and 387 year old daughter.  The patient long-term medical problems have been bipolar type I with predominant depression symptoms he has been on medications under the care of psychiatry in [place] which included fluoxetine, buspirone, lamotrigine, olanzapine.  He also has had hypercholesterolemia and GERD without esophagitis.  No history of heart disease.  No history of diabetes when he was hospitalized did  have hyperglycemia and an A1c at 6.3% so this likely represents an impaired glucose tolerance/prediabetes.  Patient reports he is back to his baseline now overall is feeling good he completed all of his medications when he is hospitalized with the COVID which include antibiotics along with 5 days of remdesivir and steroids.  Patient [place] and will also be searching for a permanent PCP once the insurance is ready.    Patient also with orthopedic related issues including a anterior cervical spine fusion and a lower lumbar fusion procedure in the past.  He has been disabled from work for several years.         Prior to Admission medications    Medication Sig Start Date End Date Taking? Authorizing Provider   Magnesium 400 MG CAPS Take by mouth   Yes [provider]   OLANZapine (ZYPREXA) 15 MG tablet Take 1 tablet by mouth nightly 10/22/22  Yes Pura SpiceParish, Elsey Holts K, MD   albuterol sulfate HFA (PROVENTIL;VENTOLIN;PROAIR) 108 (90 Base) MCG/ACT inhaler Inhale 2 puffs into the lungs every 4 hours as needed for Wheezing 09/30/22  Yes Carnes, Ashok CroonLauren Ashton, PA-C   omeprazole (PRILOSEC) 20 MG delayed release capsule Take 1 capsule by mouth daily 09/30/22  Yes Carnes, Ashok CroonLauren Ashton, PA-C   atorvastatin (LIPITOR) 10 MG tablet Take 1 tablet by mouth daily 09/30/22  Yes Carnes, Ashok CroonLauren Ashton, PA-C   busPIRone (BUSPAR) 10 MG tablet Take 1 tablet by mouth 2 times daily 09/30/22  Yes Carnes, Ashok CroonLauren Ashton, PA-C   FLUoxetine (PROZAC) 10 MG capsule Take 1 capsule by mouth daily 09/30/22  Yes Carnes, Ashok CroonLauren Ashton, PA-C   lamoTRIgine (LAMICTAL) 150 MG tablet Take 1 tablet by mouth daily 09/30/22  Yes Carnes, Ashok CroonLauren Ashton, PA-C       No Known Allergies    Past Medical History:   Diagnosis Date    Asthma     Hyperlipidemia     Obesity        History reviewed. No pertinent surgical history.    History reviewed. No pertinent family history.    Social History     Tobacco Use    Smoking  status: Former     Current packs/day: 0.00     Average packs/day: 1 pack/day for 20.0 years (20.0 ttl pk-yrs)     Types: Cigarettes     Start date: 08/2002     Quit date: 08/2022     Years since quitting: 0.1    Smokeless tobacco: Never   Substance Use Topics    Alcohol use: Not Currently     Comment: quit in 2003    Drug use: Never          Review of Systems   Constitutional:  Positive for fatigue. Negative for activity change and fever.   HENT: Negative.  Negative for dental problem, ear pain, hearing loss, postnasal drip, rhinorrhea and sore throat.    Eyes:  Negative for discharge and visual disturbance.   Respiratory:  Negative for apnea, cough, shortness  of breath and wheezing.         Patient has had chronic snoring   Cardiovascular:  Negative for chest pain, palpitations and leg swelling.   Gastrointestinal:  Negative for abdominal distention, abdominal pain, diarrhea, nausea and vomiting.   Endocrine: Negative for polydipsia, polyphagia and polyuria.   Genitourinary:  Negative for difficulty urinating, dysuria, flank pain and frequency.   Musculoskeletal:  Positive for back pain and neck pain. Negative for arthralgias, joint swelling and myalgias.   Skin:  Negative for color change and rash.   Neurological:  Negative for dizziness, syncope, weakness, numbness and headaches.   Hematological:  Negative for adenopathy.   Psychiatric/Behavioral:  Positive for dysphoric mood. Negative for behavioral problems and suicidal ideas. The patient is not nervous/anxious.         Objective       Vitals:    10/22/22 1101   BP: 104/82   Site: Left Upper Arm   Position: Sitting   Cuff Size: Large Adult   Pulse: 94   SpO2: 94%   Weight: (!) 150.4 kg (331 lb 9.6 oz)   Height: 1.753 m (5\' 9" )        BP Readings from Last 3 Encounters:   10/22/22 104/82   09/30/22 114/81     Wt Readings from Last 3 Encounters:   10/22/22 (!) 150.4 kg (331 lb 9.6 oz)   09/30/22 (!) 147.8 kg (325 lb 13.4 oz)       Physical Exam  Vitals and  nursing note reviewed.   Constitutional:       Appearance: Normal appearance.   HENT:      Head: Atraumatic.      Nose: Nose normal.      Mouth/Throat:      Mouth: Mucous membranes are moist.      Pharynx: Oropharynx is clear.   Eyes:      General: No scleral icterus.     Extraocular Movements: Extraocular movements intact.      Pupils: Pupils are equal, round, and reactive to light.   Cardiovascular:      Rate and Rhythm: Normal rate and regular rhythm.      Pulses: Normal pulses.      Heart sounds: Normal heart sounds. No murmur heard.  Pulmonary:      Effort: Pulmonary effort is normal.      Breath sounds: Normal breath sounds. No wheezing.   Abdominal:      General: Abdomen is flat. There is no distension.      Palpations: Abdomen is soft.      Tenderness: There is no abdominal tenderness.   Musculoskeletal:         General: No swelling or deformity. Normal range of motion.      Cervical back: Normal range of motion and neck supple. No tenderness.   Lymphadenopathy:      Cervical: No cervical adenopathy.   Skin:     General: Skin is warm and dry.      Coloration: Skin is not jaundiced.      Findings: No rash.   Neurological:      General: No focal deficit present.      Mental Status: He is alert and oriented to person, place, and time. Mental status is at baseline.   Psychiatric:         Attention and Perception: Attention and perception normal.         Mood and Affect: Affect normal. Mood is depressed.  Speech: Speech normal.         Behavior: Behavior normal.         Thought Content: Thought content normal. Thought content does not include suicidal ideation.         Cognition and Memory: Cognition and memory normal.         Judgment: Judgment normal.      Comments: Although patient has had depressed mood which has been chronic overall he states he is doing well and stable on current medication regimen.                Admission on 09/24/2022, Discharged on 09/30/2022   Component Date Value Ref Range  Status    WBC 09/24/2022 5.9  3.8 - 10.6 x10e3/mcL Final    RBC 09/24/2022 5.25  4.00 - 5.60 x10e6/mcL Final    Hemoglobin 09/24/2022 15.9  13.0 - 17.3 g/dL Final    Hematocrit 09/60/454012/28/2023 53.1 (H)  38.0 - 52.0 % Final    MCV 09/24/2022 101.1 (H)  84.0 - 100.0 fL Final    MCH 09/24/2022 30.3  27.0 - 34.5 pg Final    MCHC 09/24/2022 29.9 (L)  32.0 - 36.0 g/dL Final    RDW 98/11/914712/28/2023 13.8  11.0 - 16.0 % Final    Platelets 09/24/2022 133 (L)  140 - 440 x10e3/mcL Final    MPV 09/24/2022 9.6  7.2 - 13.2 fL Final    Neutrophils % 09/24/2022 51.9  42.0 - 74.0 % Final    Lymphocytes 09/24/2022 25.9  15.0 - 45.0 % Final    Monocytes 09/24/2022 19.7 (H)  4.0 - 12.0 % Final    Eosinophils % 09/24/2022 2.0  0.0 - 7.0 % Final    Basophils % 09/24/2022 0.3  0.0 - 2.0 % Final    Neutrophils Absolute 09/24/2022 3.1  1.6 - 7.3 x10e3/mcL Final    Absolute Lymph # 09/24/2022 1.5  1.0 - 3.2 x10e3/mcL Final    Absolute Mono # 09/24/2022 1.2 (H)  0.3 - 1.0 x10e3/mcL Final    Absolute Eos # 09/24/2022 0.1  0.0 - 0.5 x10e3/mcL Final    Absolute Baso # 09/24/2022 0.0  0.0 - 0.2 x10e3/mcL Final    Immature Granulocytes 09/24/2022 0.2  0.0 - 0.6 % Final    Immature Grans (Abs) 09/24/2022 0.01  0.00 - 0.06 x10e3/mcL Final    Sodium 09/24/2022 143  135 - 145 mmol/L Final    Potassium 09/24/2022 4.4  3.5 - 5.3 mmol/L Final    Chloride 09/24/2022 101  98 - 107 mmol/L Final    CO2 09/24/2022 36 (H)  22 - 29 mmol/L Final    Glucose 09/24/2022 105 (H)  70 - 99 mg/dL Final    BUN 82/95/621312/28/2023 10  6 - 20 mg/dL Final    Creatinine 08/65/784612/28/2023 1.0  0.7 - 1.3 mg/dL Final    Anion Gap 96/29/528412/28/2023 6  2 - 17 mmol/L Final    OSMOLALITY CALCULATED 09/24/2022 284  270 - 287 mOsm/kg Final    Calcium 09/24/2022 8.3 (L)  8.6 - 10.0 mg/dL Final    Total Protein 09/24/2022 6.9  6.4 - 8.3 g/dL Final    Albumin 13/24/401012/28/2023 3.6  3.5 - 5.2 g/dL Final    Globulin 27/25/366412/28/2023 3.3  1.9 - 4.4 g/dL Final    Albumin/Globulin Ratio 09/24/2022 1.09  1.00 - 2.70 Final    Total  Bilirubin 09/24/2022 0.29  0.00 - 1.20 mg/dL Final    Alk Phosphatase 09/24/2022 78  40 - 130 unit/L Final    AST 09/24/2022 39  0 - 50 unit/L Final    ALT 09/24/2022 41  0 - 50 unit/L Final    Est, Glom Filt Rate 09/24/2022 89  >=60 mL/min/1.7941m Final    Comment: VERIFIED by Discern Expert.  GFR Interpretation:                                                                         % OF  KIDNEY  GFR                                                        STAGE  FUNCTION  ==================================================================================    > 90        Normal kidney function                       STAGE 1  90-100%  89 to 60      Mild loss of kidney function                 STAGE 2  80-60%  59 to 45      Mild to moderate loss of kidney function     STAGE 3a  59-45%  44 to 30      Moderate to severe loss of kidney function   STAGE 3b  44-30%  29 to 15      Severe loss of kidney function               STAGE 4  29-15%    < 15        Kidney failure                               STAGE 5  <15%  ==================================================================================  Modified from National Kidney Foundation    GFR Calculation performed using the CKD-EPI 2021 equation developed for use  with IDMS traceable creatinine methods and                            is the calculation recommended by  the [facility] for estimating GFR in adults.      Ventricular Rate 09/24/2022 83  BPM Final    P-R Interval 09/24/2022 182  ms Final    QRS Duration 09/24/2022 110  ms Final    Q-T Interval 09/24/2022 450  ms Final    QTc Calculation (Bazett) 09/24/2022 490  ms Final    P Axis 09/24/2022 56  degrees Final    R Axis 09/24/2022 168  degrees Final    T Axis 09/24/2022 120  degrees Final    Diagnosis 09/24/2022    Final                    Value:SINUS RHYTHM  ABNORMAL RIGHT AXIS DEVIATION [QRS AXIS > 100]  LOW QRS VOLTAGE IN CHEST LEADS [QRS DEFLECTION < 1.0 MV IN CHEST LEADS]  RIGHT BUNDLE BRANCH BLOCK  [120+ MS QRS DURATION, UPRIGHT V1, 40+ MS S IN  I/AVL/V4/V5/V6]  ANTEROSEPTAL INFARCT [40+ MS Q WAVE IN V1-V4], AGE UNDETERMINED  ABNORMAL ECG    Confirmed by Jamas Lav ([id number]) on 09/24/2022 2:53:04 PM      Troponin, High Sensitivity 09/24/2022 16  0 - 22 ng/L Final    The immunoassay is intended to aid in the diagnosis of myocardial infarction.    NT Pro-BNP 09/24/2022 214 (H)  0 - 125 pg/mL Final    SARS-CoV-2 09/24/2022 Detected (A)  Not Detected Final    Comment: [facility] [place]  The cobas SARS-CoV-2 & Influenza A/B Nucleic acid test for use on the cobas  Liat System (cobas SARS-CoV-2 & Influenza A/B) is an automated multiplex  real-time RT-PCR assay intended for the simultaneous rapid in vitro  qualitative detection and differentiation of SARS-CoV-2, influenza A, and  influenza B virus RNA in healthcare provider-collected nasopharyngeal and  nasal swabs, from individuals suspected of respiratory viral infection  consistent with COVID-19 by their healthcare provider.  Cobas SARS-CoV-2 & Influenza A/B is intended for use in the simultaneous rapid  in vitro detection and differentiation of SARS-CoV-2, influenza A virus, and  influenza B virus nucleic acids in clinical specimens and is not intended to  detect influenza C virus. SARS-CoV-2, influenza A and influenza B viral RNA is  generally detectable in respiratory specimens during the acute phase of  infection. Positive results are indicative of active infection but do not rule  out bacterial                            infection or co-infection with other pathogens not detected by  the test. Clinical correlation with patient history and other diagnostic  information is necessary to determine patient infection status. The agent  detected may not be the definite cause of disease.  Negative results do not preclude SARS-CoV-2, influenza A and/or influenza B  infection and should not be used as the sole basis for diagnosis, treatment or  other patient  management decisions. Negative results must be combined with  clinical observations, patient history, and/or epidemiological information.  The cobas SARS-CoV-2 & Influenza A/B Nucleic acid test is only for use under  the Food and Drug Administration's Emergency Use Authorization.      INFLUENZA A 09/24/2022 Not Detected  Not Detected Final    [facility] [place]    INFLUENZA B 09/24/2022 Not Detected  Not Detected Final    [facility] [place]    CRP 09/25/2022 3.27 (H)  0.00 - 0.50 mg/dL Final    Ferritin 16/06/9603 198.7  30.0 - 400.0 ng/mL Final    D-Dimer, Quant 09/25/2022 0.39  0.19 - 0.50 [id number] Final    Comment: D DIMER INTERPRETATION:  New [employer] methodology - Effective 06/16/16    A cutoff range of < 0.50 mcg/mL FEU has been established with this methodology  as a negative predictive value for deep vein thrombosis and pulmonary  embolism. (98% negative predictive value)    In the course of disseminated intravascular coagulation, the measurement of  D-Dimer can be of aid in monitoring the treatment or progression of disease  processes.      Sodium 09/25/2022 138  135 - 145 mmol/L Final    Potassium 09/25/2022 5.3  3.5 - 5.3 mmol/L Final    Chloride 09/25/2022 100  98 - 107 mmol/L Final  CO2 09/25/2022 30 (H)  22 - 29 mmol/L Final    Glucose 09/25/2022 156 (H)  70 - 99 mg/dL Final    BUN 16/10/960412/29/2023 11  6 - 20 mg/dL Final    Creatinine 54/09/811912/29/2023 0.8  0.7 - 1.3 mg/dL Final    Anion Gap 14/78/295612/29/2023 8  2 - 17 mmol/L Final    OSMOLALITY CALCULATED 09/25/2022 278  270 - 287 mOsm/kg Final    Calcium 09/25/2022 8.2 (L)  8.6 - 10.0 mg/dL Final    CALCIUM,CORRECTED,CCA 09/25/2022 9.0  8.6 - 10.0 mg/dL Final    Total Protein 09/25/2022 7.2  6.4 - 8.3 g/dL Final    Albumin 21/30/865712/29/2023 3.6  3.5 - 5.2 g/dL Final    Globulin 84/69/629512/29/2023 3.6  1.9 - 4.4 g/dL Final    Albumin/Globulin Ratio 09/25/2022 1.00  1.00 - 2.70 Final    Total Bilirubin 09/25/2022 0.25  0.00 - 1.20 mg/dL Final    Alk Phosphatase 09/25/2022 75  40 -  130 unit/L Final    AST 09/25/2022 44  0 - 50 unit/L Final    ALT 09/25/2022 45  0 - 50 unit/L Final    Est, Glom Filt Rate 09/25/2022 105  >=60 mL/min/1.833m Final    Comment: VERIFIED by Discern Expert.  GFR Interpretation:                                                                         % OF  KIDNEY  GFR                                                        STAGE  FUNCTION  ==================================================================================    > 90        Normal kidney function                       STAGE 1  90-100%  89 to 60      Mild loss of kidney function                 STAGE 2  80-60%  59 to 45      Mild to moderate loss of kidney function     STAGE 3a  59-45%  44 to 30      Moderate to severe loss of kidney function   STAGE 3b  44-30%  29 to 15      Severe loss of kidney function               STAGE 4  29-15%    < 15        Kidney failure                               STAGE 5  <15%  ==================================================================================  Modified from National Kidney Foundation    GFR Calculation performed using the CKD-EPI 2021 equation developed for use  with IDMS traceable creatinine methods and  is the calculation recommended by  the [facility] for estimating GFR in adults.      Potassium 09/25/2022 5.3  3.5 - 5.3 mmol/L Final    Phosphorus 09/25/2022 2.5  2.5 - 4.5 mg/dL Final    WBC 09/25/2022 5.8  3.8 - 10.6 x10e3/mcL Final    RBC 09/25/2022 5.25  4.00 - 5.60 x10e6/mcL Final    Hemoglobin 09/25/2022 16.0  13.0 - 17.3 g/dL Final    Hematocrit 09/25/2022 53.6 (H)  38.0 - 52.0 % Final    MCV 09/25/2022 102.1 (H)  84.0 - 100.0 fL Final    MCH 09/25/2022 30.5  27.0 - 34.5 pg Final    MCHC 09/25/2022 29.9 (L)  32.0 - 36.0 g/dL Final    RDW 09/25/2022 13.8  11.0 - 16.0 % Final    Platelets 09/25/2022 152  140 - 440 x10e3/mcL Final    MPV 09/25/2022 10.0  7.2 - 13.2 fL Final    NRBC Automated 09/25/2022 0.0  0.0 -  0.2 % Final    NRBC Absolute 09/25/2022 0.000  0.000 - 0.012 x10e3/mcL Final    Neutrophils % 09/25/2022 87.1 (H)  42.0 - 74.0 % Final    Lymphocytes 09/25/2022 10.0 (L)  15.0 - 45.0 % Final    Monocytes 09/25/2022 2.4 (L)  4.0 - 12.0 % Final    Eosinophils % 09/25/2022 0.0  0.0 - 7.0 % Final    Basophils % 09/25/2022 0.3  0.0 - 2.0 % Final    Neutrophils Absolute 09/25/2022 5.1  1.6 - 7.3 x10e3/mcL Final    Absolute Lymph # 09/25/2022 0.6 (L)  1.0 - 3.2 x10e3/mcL Final    Absolute Mono # 09/25/2022 0.1 (L)  0.3 - 1.0 x10e3/mcL Final    Absolute Eos # 09/25/2022 0.0  0.0 - 0.5 x10e3/mcL Final    Absolute Baso # 09/25/2022 0.0  0.0 - 0.2 x10e3/mcL Final    Immature Granulocytes 09/25/2022 0.2  0.0 - 0.6 % Final    Immature Grans (Abs) 09/25/2022 0.01  0.00 - 0.06 x10e3/mcL Final    Cholesterol 09/25/2022 114  100 - 200 mg/dL Final    Comment: The National Cholesterol Education Program has published reference  cholesterol values for cardiovascular risk to be:    Less than 200 mg/dL     = Low Risk    200 to 239 mg/dL        = Borderline Risk    240mg /dL and greater    = High Risk      HDL 09/25/2022 37 (L)  >=40 mg/dL Final    Comment: The National Lipid Association and the [employer] Cholesterol Education Program  (NCEP) have set the guidelines for high-density lipoprotein (HDL) cholesterol  in adults ages 38 and up.      Triglycerides 09/25/2022 80  0 - 149 mg/dL Final    Comment:   TRIGLYCERIDE INTERPRETATION:                          Recommended Fasting Triglyc Levels for Adults                        =============================================                        Desirable                        <  150 mg/dL                        Average                          < 200 mg/dL                        Borderline High             200 to 500 mg/dL                        Hypertriglyceridemic             > 500 mg/dL                        =============================================      LDL Cholesterol 09/25/2022 61.0   0.0 - 100.0 mg/dL Final    LDL/HDL Ratio 09/25/2022 1.6   Final    Chol/HDL Ratio 09/25/2022 3.1  0.0 - 4.4 Final    VLDL 09/25/2022 16.0  5.0 - 40.0 mg/dL Final    TSH 74/25/9563 1.310  0.358 - 3.740 mcIU/mL Final    Comment: TSH INTERPRETATION:    Controversy exists over an acceptable TSH range. Some experts argue that a  narrower reference range is better and will increase the detection of thyroid  disease, particularly marginal hypothyroidism.      Total CK 09/25/2022 400 (H)  20 - 200 unit/L Final    C Diff Toxin Interpretation 09/25/2022 Negative for C difficile Toxin.   Final    Reference Range:  Negative.    C.diff Toxin/Antigen 09/25/2022 Negative  Negative Final    Procalcitonin 09/26/2022 0.07  <=0.24 ng/mL Final    Comment: PROCALCITONIN INTERPRETATION (based on clinical content from  www.nebraskamed.com/asp):    Normal: <0.25 ng/mL (infants >72 hours - adults)    Suspected Lower Respiratory Tract Infection:  0.1 - 0.24 ng/mL - Low likelihood for bacterial infection; Antibiotics  discouraged.  >=0.25 ng/mL - Increased likelihood bacterial infection; Antibiotics  encouraged.    Suspected Sepsis: Strongly consider initiating antibiotics in all unstable  patients.  0.1 - 0.5 ng/mL - Low likelihood for sepsis; Antibiotics discouraged.  >0.5 ng/mL      - Increased likelihood sepsis; Antibiotics encouraged.  >2.0 ng/mL      - High risk of sepsis/septic shock; Antibiotics strongly  encouraged.    Decisions on antibiotic use should not be based solely on procalcitonin  levels. If antibiotics are administered, repeat procalcitonin testing should  be obtained every 2-3 days to consider early antibiotic cessation. PCT is a  dynamic biomarker and most useful when trends are analyzed over time in  accompaniment with o                           ther clinical data. Interpretation should be based upon  clinical context and algorithms available on the Antimicrobial [employer]wardship  Website (www.nebraskamed.com/asp).       Procalcitonin 09/25/2022 0.05  <=0.24 ng/mL Final    Comment: PROCALCITONIN INTERPRETATION (based on clinical content from  www.nebraskamed.com/asp):    Normal: <0.25 ng/mL (infants >72 hours - adults)    Suspected Lower Respiratory Tract Infection:  0.1 - 0.24 ng/mL - Low likelihood for bacterial infection; Antibiotics  discouraged.  >=0.25 ng/mL - Increased likelihood  bacterial infection; Antibiotics  encouraged.    Suspected Sepsis: Strongly consider initiating antibiotics in all unstable  patients.  0.1 - 0.5 ng/mL - Low likelihood for sepsis; Antibiotics discouraged.  >0.5 ng/mL      - Increased likelihood sepsis; Antibiotics encouraged.  >2.0 ng/mL      - High risk of sepsis/septic shock; Antibiotics strongly  encouraged.    Decisions on antibiotic use should not be based solely on procalcitonin  levels. If antibiotics are administered, repeat procalcitonin testing should  be obtained every 2-3 days to consider early antibiotic cessation. PCT is a  dynamic biomarker and most useful when trends are analyzed over time in  accompaniment with o                           ther clinical data. Interpretation should be based upon  clinical context and algorithms available on the [employer]  Website (www.nebraskamed.com/asp).      Total Protein 09/26/2022 6.9  6.4 - 8.3 g/dL Final    Albumin 16/06/9603 3.4 (L)  3.5 - 5.2 g/dL Final    Total Bilirubin 09/26/2022 0.19  0.00 - 1.20 mg/dL Final    Alk Phosphatase 09/26/2022 74  40 - 130 unit/L Final    Bilirubin, Direct 09/26/2022 <0.20  0.00 - 0.30 mg/dL Final    Comment: Bilirubin Indirect unable to be calculated because Bilirubin Direct is less  than 0.2 mg/dL.      AST 09/26/2022 34  0 - 50 unit/L Final    ALT 09/26/2022 41  0 - 50 unit/L Final    CRP 09/26/2022 1.46 (H)  0.00 - 0.50 mg/dL Final    Total Protein 09/27/2022 6.8  6.4 - 8.3 g/dL Final    Albumin 54/05/8118 3.4 (L)  3.5 - 5.2 g/dL Final    Total Bilirubin 09/27/2022 0.29  0.00 - 1.20 mg/dL  Final    Alk Phosphatase 09/27/2022 66  40 - 130 unit/L Final    Bilirubin, Direct 09/27/2022 <0.20  0.00 - 0.30 mg/dL Final    Comment: Bilirubin Indirect unable to be calculated because Bilirubin Direct is less  than 0.2 mg/dL.      AST 09/27/2022 30  0 - 50 unit/L Final    ALT 09/27/2022 37  0 - 50 unit/L Final    CRP 09/27/2022 0.66 (H)  0.00 - 0.50 mg/dL Final    Hemoglobin J4N 09/27/2022 6.3 (H)  4.0 - 6.0 % Final    Comment: HEMOGLOBIN A1C INTERPRETATION:    The following arbitrary ranges may be used for interpretation of the results.  However, factors such as duration of diabetes, adherence to therapy, and  patient age should also be considered in assessing degree of blood glucose  control.    Hemoglobin A1C                 Avg. Blood Sugar  --------------------------------------------------------------  6%                           135 mg/dL  7%                           170 mg/dL  8%                           205 mg/dL  9%  240 mg/dL  10%                          275 mg/dL    ======================================================    A1C                      Glucose Control  ----------------------------------------------------------------  < 6.0 %                   Normal  6.0 - 6.9 %               Abnormal  7.0 - 7.9 %               Sub-Optimal Control  > 8.0 %                   Inadequate Control      Est. Avg. Glucose, WB 09/27/2022 134   Final    Est. Avg. Glucose-calculated 09/27/2022 147   Final    Total Protein 09/28/2022 6.7  6.4 - 8.3 g/dL Final    Albumin 09/28/2022 3.4 (L)  3.5 - 5.2 g/dL Final    Total Bilirubin 09/28/2022 0.45  0.00 - 1.20 mg/dL Final    Alk Phosphatase 09/28/2022 65  40 - 130 unit/L Final    Bilirubin, Direct 09/28/2022 <0.20  0.00 - 0.30 mg/dL Final    Comment: Bilirubin Indirect unable to be calculated because Bilirubin Direct is less  than 0.2 mg/dL.      AST 09/28/2022 44  0 - 50 unit/L Final    ALT 09/28/2022 55 (H)  0 - 50 unit/L Final    Total  Protein 09/29/2022 6.5  6.4 - 8.3 g/dL Final    Albumin 09/29/2022 3.3 (L)  3.5 - 5.2 g/dL Final    Total Bilirubin 09/29/2022 0.46  0.00 - 1.20 mg/dL Final    Alk Phosphatase 09/29/2022 66  40 - 130 unit/L Final    Bilirubin, Direct 09/29/2022 <0.20  0.00 - 0.30 mg/dL Final    Comment: Bilirubin Indirect unable to be calculated because Bilirubin Direct is less  than 0.2 mg/dL.      AST 09/29/2022 48  0 - 50 unit/L Final    ALT 09/29/2022 71 (H)  0 - 50 unit/L Final    Bilirubin, Indirect 09/29/2022 0.26  0.00 - 1.00 mg/dL Final    WBC 09/29/2022 8.9  3.8 - 10.6 x10e3/mcL Final    RBC 09/29/2022 5.17  4.00 - 5.60 x10e6/mcL Final    Hemoglobin 09/29/2022 15.4  13.0 - 17.3 g/dL Final    Hematocrit 09/29/2022 50.8  38.0 - 52.0 % Final    MCV 09/29/2022 98.3  84.0 - 100.0 fL Final    MCH 09/29/2022 29.8  27.0 - 34.5 pg Final    MCHC 09/29/2022 30.3 (L)  32.0 - 36.0 g/dL Final    RDW 09/29/2022 13.6  11.0 - 16.0 % Final    Platelets 09/29/2022 173  140 - 440 x10e3/mcL Final    MPV 09/29/2022 10.1  7.2 - 13.2 fL Final    NRBC Automated 09/29/2022 0.0  0.0 - 0.2 % Final    NRBC Absolute 09/29/2022 0.000  0.000 - 0.012 x10e3/mcL Final    Neutrophils % 09/29/2022 77.5 (H)  42.0 - 74.0 % Final    Lymphocytes 09/29/2022 14.7 (L)  15.0 - 45.0 % Final    Monocytes 09/29/2022 7.2  4.0 - 12.0 % Final  Eosinophils % 09/29/2022 0.1  0.0 - 7.0 % Final    Basophils % 09/29/2022 0.1  0.0 - 2.0 % Final    Neutrophils Absolute 09/29/2022 6.9  1.6 - 7.3 x10e3/mcL Final    Absolute Lymph # 09/29/2022 1.3  1.0 - 3.2 x10e3/mcL Final    Absolute Mono # 09/29/2022 0.6  0.3 - 1.0 x10e3/mcL Final    Absolute Eos # 09/29/2022 0.0  0.0 - 0.5 x10e3/mcL Final    Absolute Baso # 09/29/2022 0.0  0.0 - 0.2 x10e3/mcL Final    Immature Granulocytes 09/29/2022 0.4  0.0 - 0.6 % Final    Immature Grans (Abs) 09/29/2022 0.04  0.00 - 0.06 x10e3/mcL Final    Sodium 09/29/2022 141  135 - 145 mmol/L Final    Potassium 09/29/2022 4.3  3.5 - 5.3 mmol/L Final     Chloride 09/29/2022 100  98 - 107 mmol/L Final    CO2 09/29/2022 33 (H)  22 - 29 mmol/L Final    Glucose 09/29/2022 113 (H)  70 - 99 mg/dL Final    BUN 16/06/9603 19  6 - 20 mg/dL Final    Creatinine 54/05/8118 0.7  0.7 - 1.3 mg/dL Final    Anion Gap 14/78/2956 8  2 - 17 mmol/L Final    OSMOLALITY CALCULATED 09/29/2022 284  270 - 287 mOsm/kg Final    Calcium 09/29/2022 8.1 (L)  8.6 - 10.0 mg/dL Final    Est, Glom Filt Rate 09/29/2022 110  >=60 mL/min/1.45m Final    Comment: VERIFIED by Discern Expert.  GFR Interpretation:                                                                         % OF  KIDNEY  GFR                                                        STAGE  FUNCTION  ==================================================================================    > 90        Normal kidney function                       STAGE 1  90-100%  89 to 60      Mild loss of kidney function                 STAGE 2  80-60%  59 to 45      Mild to moderate loss of kidney function     STAGE 3a  59-45%  44 to 30      Moderate to severe loss of kidney function   STAGE 3b  44-30%  29 to 15      Severe loss of kidney function               STAGE 4  29-15%    < 15        Kidney failure  STAGE 5  <15%  ==================================================================================  Modified from [employer]    GFR Calculation performed using the CKD-EPI 2021 equation developed for use  with IDMS traceable creatinine methods and                            is the calculation recommended by  the [facility] for estimating GFR in adults.        No results found for this visit on 10/22/22.              An electronic signature was used to authenticate this note.    --Pura Spice, MD

## 2022-10-22 NOTE — Assessment & Plan Note (Signed)
Patient developed hyperglycemia in the hospital although he was on steroids as well.  His A1c was at 6.3%.  This would be consistent with an impaired glucose tolerance/prediabetes.  This will need to be monitored.  Not currently on medication although this is also contributed from his obesity.  Ongoing management with weight management when he gets established with the new PCP.

## 2022-10-22 NOTE — Assessment & Plan Note (Signed)
Long history of snoring.  Possible obstructive sleep apnea.  When he was hospitalized with hypoxia for from [place] they did discuss with patient getting a sleep study.  I have reviewed this with him today and would recommend a sleep study although he is not willing to schedule that at this time it would be important going forward as well.

## 2022-10-22 NOTE — Assessment & Plan Note (Signed)
Long history hypercholesterolemia has been on atorvastatin and continues on same without side effects.  Lipid profile in hospital was good.

## 2022-10-22 NOTE — Assessment & Plan Note (Signed)
Much of patient's chronic medical problems such as the hyperglycemia hyperlipidemia are related to the morbid obesity.  Long-term issues regarding potential weight gain dietary issues and whether patient would be a candidate for interventional procedure and bariatric surgery should be discussed as well going forward.  Patient likely will be establishing with a new PCP since he will be on a new insurance plan this month.

## 2022-10-22 NOTE — Assessment & Plan Note (Signed)
Previous lumbar fusion.  Limited range of motion but no radiculopathic symptoms current

## 2022-11-24 ENCOUNTER — Encounter: Admit: 2022-11-24 | Discharge: 2022-11-24 | Payer: MEDICAID

## 2022-11-24 DIAGNOSIS — R7302 Impaired glucose tolerance (oral): Secondary | ICD-10-CM

## 2022-11-24 MED ORDER — OMEPRAZOLE 20 MG PO CPDR
20 MG | ORAL_CAPSULE | Freq: Every day | ORAL | 2 refills | Status: AC
Start: 2022-11-24 — End: 2023-02-18

## 2022-11-24 MED ORDER — BUSPIRONE HCL 10 MG PO TABS
10 | ORAL_TABLET | Freq: Two times a day (BID) | ORAL | 2 refills | Status: AC
Start: 2022-11-24 — End: ?

## 2022-11-24 MED ORDER — ATORVASTATIN CALCIUM 10 MG PO TABS
10 MG | ORAL_TABLET | Freq: Every day | ORAL | 2 refills | Status: AC
Start: 2022-11-24 — End: 2023-02-18

## 2022-11-24 NOTE — Assessment & Plan Note (Signed)
Lipid profile in hospital was stable.  He continues on atorvastatin without side effects.  The prescription will be updated today which will last until he established with new PCP.

## 2022-11-24 NOTE — Assessment & Plan Note (Signed)
Patient stable with history of GERD without esophagitis.  Denies dysphagia.  Continues on omeprazole and updated prescription will be sent today.

## 2022-11-24 NOTE — Progress Notes (Signed)
Richard Dennis (DOB:  [birth year]) is a 57 y.o. male, here for evaluation of the following chief complaint(s):  Follow-up           ASSESSMENT/PLAN:  1. Impaired glucose tolerance  Assessment & Plan:   Will continue improvement with diet, low carbs, would recommend increasing walking exercise.  Will need lab follow-up within the next 3 months as well.  Although he may be establishing with a new permanent PCP.  No medication at this time.  2. Hypercholesteremia  Assessment & Plan:   Lipid profile in hospital was stable.  He continues on atorvastatin without side effects.  The prescription will be updated today which will last until he established with new PCP.  Orders:  -     atorvastatin (LIPITOR) 10 MG tablet; Take 1 tablet by mouth daily, Disp-30 tablet, R-2Normal  3. GERD without esophagitis  Assessment & Plan:   Patient stable with history of GERD without esophagitis.  Denies dysphagia.  Continues on omeprazole and updated prescription will be sent today.  Orders:  -     omeprazole (PRILOSEC) 20 MG delayed release capsule; Take 1 capsule by mouth daily, Disp-30 capsule, R-2Normal  4. Bipolar 1 disorder, depressed, partial remission ([place])  Assessment & Plan:   Long history of bipolar disorder predominant depression although based on history likely had associated mania.  He has been stable on current medication regimen.  This includes olanzapine, lamotrigine, buspirone and fluoxetine.  Denies any side effects on his current regimen.  Denies any symptoms of serotonin type syndrome.  Patient is now following with the [facility].  He will need a refill however on the buspirone currently.  Orders:  -     busPIRone (BUSPAR) 10 MG tablet; Take 1 tablet by mouth 2 times daily, Disp-60 tablet, R-2Normal  5. Obesity, morbid, BMI 40.0-49.9 ([place])  Assessment & Plan:  Reviewed plan; Much of patient's chronic medical problems such as the hyperglycemia hyperlipidemia are related to the morbid obesity.   Long-term issues regarding potential weight gain dietary issues and whether patient would be a candidate for interventional procedure and bariatric surgery should be discussed as well going forward.  Patient likely will be establishing with a new PCP since he is on a new insurance plan      No follow-ups on file.       Subjective   SUBJECTIVE/OBJECTIVE:  Patient presents for follow-up.  His initial visit was on 10/22/2022.  As discussed at that visit patient had recently relocated to [place] from [place].  We again reviewed the labs that were done at the hospitalization previously in January.  Patient now has insurance and will be making appointment with permanent primary care provider.  He is already established with the [facility] behavioral health clinic who will be taking over his regular behavioral health med prescribing although he will need an updated prescription today on buspirone.  Patient also needs updated prescriptions on omeprazole and atorvastatin.  Patient's lipid profile when he was in the hospital was stable.  Omeprazole has been used for GERD which is stable as well on this treatment.  No residual symptoms of the COVID when he was hospitalized.  His ongoing follow-up will be with his new PCP if he has not established however within the next 3 months will need to return here at which time he will need new lab studies.    Patient also with orthopedic related issues including a anterior cervical spine fusion and a  lower lumbar fusion procedure in the past.  He has been disabled from work for several years.         Prior to Admission medications    Medication Sig Start Date End Date Taking? Authorizing Provider   omeprazole (PRILOSEC) 20 MG delayed release capsule Take 1 capsule by mouth daily 11/24/22  Yes Amberli Ruegg, Ace Gins, MD   busPIRone (BUSPAR) 10 MG tablet Take 1 tablet by mouth 2 times daily 11/24/22  Yes Gavinn Collard, Ace Gins, MD   atorvastatin (LIPITOR) 10 MG tablet Take 1 tablet by mouth  daily 11/24/22  Yes Derran Sear, Ace Gins, MD   Magnesium 400 MG CAPS Take by mouth   Yes [provider]   OLANZapine (ZYPREXA) 15 MG tablet Take 1 tablet by mouth nightly 10/22/22  Yes Marin Roberts, MD   albuterol sulfate HFA (PROVENTIL;VENTOLIN;PROAIR) 108 (90 Base) MCG/ACT inhaler Inhale 2 puffs into the lungs every 4 hours as needed for Wheezing 09/30/22  Yes Carnes, Candis Shine, PA-C   FLUoxetine (PROZAC) 10 MG capsule Take 1 capsule by mouth daily 09/30/22  Yes Carnes, Candis Shine, PA-C   lamoTRIgine (LAMICTAL) 150 MG tablet Take 1 tablet by mouth daily 09/30/22  Yes Carnes, Candis Shine, PA-C       No Known Allergies    Past Medical History:   Diagnosis Date    Asthma     Hyperlipidemia     Obesity        History reviewed. No pertinent surgical history.    History reviewed. No pertinent family history.    Social History     Tobacco Use    Smoking status: Former     Current packs/day: 0.00     Average packs/day: 1 pack/day for 20.0 years (20.0 ttl pk-yrs)     Types: Cigarettes     Start date: 08/2002     Quit date: 08/2022     Years since quitting: 0.2    Smokeless tobacco: Never   Substance Use Topics    Alcohol use: Not Currently     Comment: quit in 2003    Drug use: Never          Review of Systems   Constitutional:  Positive for fatigue. Negative for activity change and fever.   HENT: Negative.  Negative for dental problem, ear pain, hearing loss, postnasal drip, rhinorrhea and sore throat.    Eyes:  Negative for discharge and visual disturbance.   Respiratory:  Negative for apnea, cough, shortness of breath and wheezing.         Patient has had chronic snoring   Cardiovascular:  Negative for chest pain, palpitations and leg swelling.   Gastrointestinal:  Negative for abdominal distention, abdominal pain, diarrhea, nausea and vomiting.   Endocrine: Negative for polydipsia, polyphagia and polyuria.   Genitourinary:  Negative for difficulty urinating, dysuria, flank pain and frequency.    Musculoskeletal:  Positive for back pain and neck pain. Negative for arthralgias, joint swelling and myalgias.   Skin:  Negative for color change and rash.   Neurological:  Negative for dizziness, syncope, weakness, numbness and headaches.   Hematological:  Negative for adenopathy.   Psychiatric/Behavioral:  Positive for dysphoric mood. Negative for behavioral problems and suicidal ideas. The patient is not nervous/anxious.         Objective       Vitals:    11/24/22 1049   BP: 110/76   Site: Left Upper Arm   Position: Sitting   Cuff  Size: Large Adult   Pulse: 82   SpO2: 93%   Weight: (!) 150.6 kg (332 lb)        BP Readings from Last 3 Encounters:   11/24/22 110/76   10/22/22 104/82   09/30/22 114/81     Wt Readings from Last 3 Encounters:   11/24/22 (!) 150.6 kg (332 lb)   10/22/22 (!) 150.4 kg (331 lb 9.6 oz)   09/30/22 (!) 147.8 kg (325 lb 13.4 oz)       Physical Exam  Vitals and nursing note reviewed.   Constitutional:       Appearance: Normal appearance.   HENT:      Head: Atraumatic.      Nose: Nose normal.      Mouth/Throat:      Mouth: Mucous membranes are moist.      Pharynx: Oropharynx is clear.   Eyes:      General: No scleral icterus.     Extraocular Movements: Extraocular movements intact.      Pupils: Pupils are equal, round, and reactive to light.   Cardiovascular:      Rate and Rhythm: Normal rate and regular rhythm.      Pulses: Normal pulses.      Heart sounds: Normal heart sounds. No murmur heard.     Comments: Trace ankle and pretibial edema bilateral  Pulmonary:      Effort: Pulmonary effort is normal.      Breath sounds: Normal breath sounds. No wheezing, rhonchi or rales.   Abdominal:      General: Abdomen is flat. There is no distension.      Palpations: Abdomen is soft.      Tenderness: There is no abdominal tenderness.   Musculoskeletal:         General: No swelling or deformity. Normal range of motion.      Cervical back: Normal range of motion and neck supple. No tenderness.      Right  lower leg: Edema present.      Left lower leg: Edema present.   Lymphadenopathy:      Cervical: No cervical adenopathy.   Skin:     General: Skin is warm and dry.      Coloration: Skin is not jaundiced.   Neurological:      General: No focal deficit present.      Mental Status: He is alert and oriented to person, place, and time. Mental status is at baseline.   Psychiatric:         Attention and Perception: Attention and perception normal.         Mood and Affect: Affect normal. Mood is depressed.         Speech: Speech normal.         Behavior: Behavior normal.         Thought Content: Thought content normal. Thought content does not include suicidal ideation.         Cognition and Memory: Cognition and memory normal.         Judgment: Judgment normal.      Comments:  depressed mood which has been chronic overall he states he is doing well and stable on current medication regimen.                Admission on 09/24/2022, Discharged on 09/30/2022   Component Date Value Ref Range Status    WBC 09/24/2022 5.9  3.8 - 10.6 x10e3/mcL Final    RBC 09/24/2022 5.25  4.00 - 5.60 x10e6/mcL Final    Hemoglobin 09/24/2022 15.9  13.0 - 17.3 g/dL Final    Hematocrit 09/24/2022 53.1 (H)  38.0 - 52.0 % Final    MCV 09/24/2022 101.1 (H)  84.0 - 100.0 fL Final    MCH 09/24/2022 30.3  27.0 - 34.5 pg Final    MCHC 09/24/2022 29.9 (L)  32.0 - 36.0 g/dL Final    RDW 09/24/2022 13.8  11.0 - 16.0 % Final    Platelets 09/24/2022 133 (L)  140 - 440 x10e3/mcL Final    MPV 09/24/2022 9.6  7.2 - 13.2 fL Final    Neutrophils % 09/24/2022 51.9  42.0 - 74.0 % Final    Lymphocytes 09/24/2022 25.9  15.0 - 45.0 % Final    Monocytes 09/24/2022 19.7 (H)  4.0 - 12.0 % Final    Eosinophils % 09/24/2022 2.0  0.0 - 7.0 % Final    Basophils % 09/24/2022 0.3  0.0 - 2.0 % Final    Neutrophils Absolute 09/24/2022 3.1  1.6 - 7.3 x10e3/mcL Final    Absolute Lymph # 09/24/2022 1.5  1.0 - 3.2 x10e3/mcL Final    Absolute Mono # 09/24/2022 1.2 (H)  0.3 - 1.0  x10e3/mcL Final    Absolute Eos # 09/24/2022 0.1  0.0 - 0.5 x10e3/mcL Final    Absolute Baso # 09/24/2022 0.0  0.0 - 0.2 x10e3/mcL Final    Immature Granulocytes 09/24/2022 0.2  0.0 - 0.6 % Final    Immature Grans (Abs) 09/24/2022 0.01  0.00 - 0.06 x10e3/mcL Final    Sodium 09/24/2022 143  135 - 145 mmol/L Final    Potassium 09/24/2022 4.4  3.5 - 5.3 mmol/L Final    Chloride 09/24/2022 101  98 - 107 mmol/L Final    CO2 09/24/2022 36 (H)  22 - 29 mmol/L Final    Glucose 09/24/2022 105 (H)  70 - 99 mg/dL Final    BUN 09/24/2022 10  6 - 20 mg/dL Final    Creatinine 09/24/2022 1.0  0.7 - 1.3 mg/dL Final    Anion Gap 09/24/2022 6  2 - 17 mmol/L Final    OSMOLALITY CALCULATED 09/24/2022 284  270 - 287 mOsm/kg Final    Calcium 09/24/2022 8.3 (L)  8.6 - 10.0 mg/dL Final    Total Protein 09/24/2022 6.9  6.4 - 8.3 g/dL Final    Albumin 09/24/2022 3.6  3.5 - 5.2 g/dL Final    Globulin 09/24/2022 3.3  1.9 - 4.4 g/dL Final    Albumin/Globulin Ratio 09/24/2022 1.09  1.00 - 2.70 Final    Total Bilirubin 09/24/2022 0.29  0.00 - 1.20 mg/dL Final    Alk Phosphatase 09/24/2022 78  40 - 130 unit/L Final    AST 09/24/2022 39  0 - 50 unit/L Final    ALT 09/24/2022 41  0 - 50 unit/L Final    Est, Glom Filt Rate 09/24/2022 89  >=60 mL/min/1.36mFinal    Comment: VERIFIED by Discern Expert.  GFR Interpretation:                                                                         % OF  KIDNEY  GFR  STAGE  FUNCTION  ==================================================================================    > 90        Normal kidney function                       STAGE 1  90-100%  89 to 60      Mild loss of kidney function                 STAGE 2  80-60%  59 to 45      Mild to moderate loss of kidney function     STAGE 3a  59-45%  44 to 30      Moderate to severe loss of kidney function   STAGE 3b  44-30%  29 to 15      Severe loss of kidney function               STAGE 4  29-15%    < 15         Kidney failure                               STAGE 5  <15%  ==================================================================================  Modified from [place]    GFR Calculation performed using the CKD-EPI 2021 equation developed for use  with IDMS traceable creatinine methods and                            is the calculation recommended by  the [facility] for estimating GFR in adults.      Ventricular Rate 09/24/2022 83  BPM Final    P-R Interval 09/24/2022 182  ms Final    QRS Duration 09/24/2022 110  ms Final    Q-T Interval 09/24/2022 450  ms Final    QTc Calculation ([place]) 09/24/2022 490  ms Final    P Axis 09/24/2022 56  degrees Final    R Axis 09/24/2022 168  degrees Final    T Axis 09/24/2022 120  degrees Final    Diagnosis 09/24/2022    Final                    Value:SINUS RHYTHM  ABNORMAL RIGHT AXIS DEVIATION [QRS AXIS > 100]  LOW QRS VOLTAGE IN CHEST LEADS [QRS DEFLECTION < 1.0 MV IN CHEST LEADS]  RIGHT BUNDLE BRANCH BLOCK [120+ MS QRS DURATION, UPRIGHT V1, 40+ MS S IN  I/AVL/V4/V5/V6]  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    NT Pro-BNP 09/24/2022 214 (H)  0 - 125 pg/mL Final    SARS-CoV-2 09/24/2022 Detected (A)  Not Detected Final    Comment: [facility] [place]  The cobas SARS-CoV-2 & Influenza A/B Nucleic acid test for use on the cobas  Liat System (cobas SARS-CoV-2 & Influenza A/B) is an automated multiplex  real-time RT-PCR assay intended for the simultaneous rapid in vitro  qualitative detection and differentiation of SARS-CoV-2, influenza A, and  influenza B virus RNA in healthcare provider-collected nasopharyngeal and  nasal swabs, from individuals suspected of respiratory viral infection  consistent with  COVID-19 by their healthcare provider.  Cobas SARS-CoV-2 & Influenza A/B is intended for use in the simultaneous rapid  in vitro detection and differentiation of SARS-CoV-2, influenza A virus, and  influenza B virus nucleic acids in clinical specimens and is not intended to  detect influenza C virus. SARS-CoV-2, influenza A and influenza B viral RNA is  generally detectable in respiratory specimens during the acute phase of  infection. Positive results are indicative of active infection but do not rule  out bacterial                            infection or co-infection with other pathogens not detected by  the test. Clinical correlation with patient history and other diagnostic  information is necessary to determine patient infection status. The agent  detected may not be the definite cause of disease.  Negative results do not preclude SARS-CoV-2, influenza A and/or influenza B  infection and should not be used as the sole basis for diagnosis, treatment or  other patient management decisions. Negative results must be combined with  clinical observations, patient history, and/or epidemiological information.  The cobas SARS-CoV-2 & Influenza A/B Nucleic acid test is only for use under  the Food and Drug Administration's Emergency Use Authorization.      INFLUENZA A 09/24/2022 Not Detected  Not Detected Final    [facility] [place]    INFLUENZA B 09/24/2022 Not Detected  Not Detected Final    [facility] [place]    CRP 09/25/2022 3.27 (H)  0.00 - 0.50 mg/dL Final    Ferritin 09/25/2022 198.7  30.0 - 400.0 ng/mL Final    D-Dimer, Quant 09/25/2022 0.39  0.19 - 0.50 [id number] Final    Comment: D DIMER INTERPRETATION:  New [place] methodology - Effective 06/16/16    A cutoff range of < 0.50 mcg/mL FEU has been established with this methodology  as a negative predictive value for deep vein thrombosis and pulmonary  embolism. (98% negative predictive value)    In the course of disseminated intravascular coagulation, the  measurement of  D-Dimer can be of aid in monitoring the treatment or progression of disease  processes.      Sodium 09/25/2022 138  135 - 145 mmol/L Final    Potassium 09/25/2022 5.3  3.5 - 5.3 mmol/L Final    Chloride 09/25/2022 100  98 - 107 mmol/L Final    CO2 09/25/2022 30 (H)  22 - 29 mmol/L Final    Glucose 09/25/2022 156 (H)  70 - 99 mg/dL Final    BUN 09/25/2022 11  6 - 20 mg/dL Final    Creatinine 09/25/2022 0.8  0.7 - 1.3 mg/dL Final    Anion Gap 09/25/2022 8  2 - 17 mmol/L Final    OSMOLALITY CALCULATED 09/25/2022 278  270 - 287 mOsm/kg Final    Calcium 09/25/2022 8.2 (L)  8.6 - 10.0 mg/dL Final    CALCIUM,CORRECTED,CCA 09/25/2022 9.0  8.6 - 10.0 mg/dL Final    Total Protein 09/25/2022 7.2  6.4 - 8.3 g/dL Final    Albumin 09/25/2022 3.6  3.5 - 5.2 g/dL Final    Globulin 09/25/2022 3.6  1.9 - 4.4 g/dL Final    Albumin/Globulin Ratio 09/25/2022 1.00  1.00 - 2.70 Final    Total Bilirubin 09/25/2022 0.25  0.00 - 1.20 mg/dL Final    Alk Phosphatase 09/25/2022 75  40 - 130 unit/L Final    AST 09/25/2022 44  0 - 50 unit/L  Final    ALT 09/25/2022 45  0 - 50 unit/L Final    Est, Glom Filt Rate 09/25/2022 105  >=60 mL/min/1.47mFinal    Comment: VERIFIED by Discern Expert.  GFR Interpretation:                                                                         % OF  KIDNEY  GFR                                                        STAGE  FUNCTION  ==================================================================================    > 90        Normal kidney function                       STAGE 1  90-100%  89 to 60      Mild loss of kidney function                 STAGE 2  80-60%  59 to 45      Mild to moderate loss of kidney function     STAGE 3a  59-45%  44 to 30      Moderate to severe loss of kidney function   STAGE 3b  44-30%  29 to 15      Severe loss of kidney function               STAGE 4  29-15%    < 15        Kidney failure                               STAGE  5  <15%  ==================================================================================  Modified from [place]   GFR Calculation performed using the CKD-EPI 2021 equation developed for use  with IDMS traceable creatinine methods and                            is the calculation recommended by  the [facility].      Potassium 09/25/2022 5.3  3.5 - 5.3 mmol/L Final    Phosphorus 09/25/2022 2.5  2.5 - 4.5 mg/dL Final    WBC 09/25/2022 5.8  3.8 - 10.6 x10e3/mcL Final    RBC 09/25/2022 5.25  4.00 - 5.60 x10e6/mcL Final    Hemoglobin 09/25/2022 16.0  13.0 - 17.3 g/dL Final    Hematocrit 09/25/2022 53.6 (H)  38.0 - 52.0 % Final    MCV 09/25/2022 102.1 (H)  84.0 - 100.0 fL Final    MCH 09/25/2022 30.5  27.0 - 34.5 pg Final    MCHC 09/25/2022 29.9 (L)  32.0 - 36.0 g/dL Final    RDW 09/25/2022 13.8  11.0 - 16.0 % Final    Platelets 09/25/2022 152  140 - 440 x10e3/mcL Final    MPV 09/25/2022 10.0  7.2 - 13.2 fL Final  NRBC Automated 09/25/2022 0.0  0.0 - 0.2 % Final    NRBC Absolute 09/25/2022 0.000  0.000 - 0.012 x10e3/mcL Final    Neutrophils % 09/25/2022 87.1 (H)  42.0 - 74.0 % Final    Lymphocytes 09/25/2022 10.0 (L)  15.0 - 45.0 % Final    Monocytes 09/25/2022 2.4 (L)  4.0 - 12.0 % Final    Eosinophils % 09/25/2022 0.0  0.0 - 7.0 % Final    Basophils % 09/25/2022 0.3  0.0 - 2.0 % Final    Neutrophils Absolute 09/25/2022 5.1  1.6 - 7.3 x10e3/mcL Final    Absolute Lymph # 09/25/2022 0.6 (L)  1.0 - 3.2 x10e3/mcL Final    Absolute Mono # 09/25/2022 0.1 (L)  0.3 - 1.0 x10e3/mcL Final    Absolute Eos # 09/25/2022 0.0  0.0 - 0.5 x10e3/mcL Final    Absolute Baso # 09/25/2022 0.0  0.0 - 0.2 x10e3/mcL Final    Immature Granulocytes 09/25/2022 0.2  0.0 - 0.6 % Final    Immature Grans (Abs) 09/25/2022 0.01  0.00 - 0.06 x10e3/mcL Final    Cholesterol 09/25/2022 114  100 - 200 mg/dL Final    Comment: The National Cholesterol Education Program has published  reference  cholesterol values for cardiovascular risk to be:    Less than 200 mg/dL     = Low Risk    200 to 239 mg/dL        = Borderline Risk    250m/dL and greater    = High Risk      HDL 09/25/2022 37 (L)  >=40 mg/dL Final    Comment: The National Lipid Association and the N[employer]Cholesterol Education Program  (NCEP) have set the guidelines for high-density lipoprotein (HDL) cholesterol  in adults ages 121and up.      Triglycerides 09/25/2022 80  0 - 149 mg/dL Final    Comment:   TRIGLYCERIDE INTERPRETATION:                          Recommended Fasting Triglyc Levels for Adults                        =============================================                        Desirable                        < 150 mg/dL                        Average                          < 200 mg/dL                        Borderline High             200 to 500 mg/dL                        Hypertriglyceridemic             > 500 mg/dL                        =============================================      LDL Cholesterol 09/25/2022 61.0  0.0 - 100.0 mg/dL Final    LDL/HDL Ratio 09/25/2022 1.6   Final    Chol/HDL Ratio 09/25/2022 3.1  0.0 - 4.4 Final    VLDL 09/25/2022 16.0  5.0 - 40.0 mg/dL Final    TSH 09/25/2022 1.310  0.358 - 3.740 mcIU/mL Final    Comment: TSH INTERPRETATION:    Controversy exists over an acceptable TSH range. Some experts argue that a  narrower reference range is better and will increase the detection of thyroid  disease, particularly marginal hypothyroidism.      Total CK 09/25/2022 400 (H)  20 - 200 unit/L Final    C Diff Toxin Interpretation 09/25/2022 Negative for C difficile Toxin.   Final    Reference Range:  Negative.    C.diff Toxin/Antigen 09/25/2022 Negative  Negative Final    Procalcitonin 09/26/2022 0.07  <=0.24 ng/mL Final    Comment: PROCALCITONIN INTERPRETATION (based on clinical content from  www.nebraskamed.com/asp):    Normal: <0.25 ng/mL (infants >72 hours - adults)    Suspected Lower  Respiratory Tract Infection:  0.1 - 0.24 ng/mL - Low likelihood for bacterial infection; Antibiotics  discouraged.  >=0.25 ng/mL - Increased likelihood bacterial infection; Antibiotics  encouraged.    Suspected Sepsis: Strongly consider initiating antibiotics in all unstable  patients.  0.1 - 0.5 ng/mL - Low likelihood for sepsis; Antibiotics discouraged.  >0.5 ng/mL      - Increased likelihood sepsis; Antibiotics encouraged.  >2.0 ng/mL      - High risk of sepsis/septic shock; Antibiotics strongly  encouraged.    Decisions on antibiotic use should not be based solely on procalcitonin  levels. If antibiotics are administered, repeat procalcitonin testing should  be obtained every 2-3 days to consider early antibiotic cessation. PCT is a  dynamic biomarker and most useful when trends are analyzed over time in  accompaniment with o                           ther clinical data. Interpretation should be based upon  clinical context and algorithms available on the Antimicrobial Stewardship  Website (www.nebraskamed.com/asp).      Procalcitonin 09/25/2022 0.05  <=0.24 ng/mL Final    Comment: PROCALCITONIN INTERPRETATION (based on clinical content from  www.nebraskamed.com/asp):    Normal: <0.25 ng/mL (infants >72 hours - adults)    Suspected Lower Respiratory Tract Infection:  0.1 - 0.24 ng/mL - Low likelihood for bacterial infection; Antibiotics  discouraged.  >=0.25 ng/mL - Increased likelihood bacterial infection; Antibiotics  encouraged.    Suspected Sepsis: Strongly consider initiating antibiotics in all unstable  patients.  0.1 - 0.5 ng/mL - Low likelihood for sepsis; Antibiotics discouraged.  >0.5 ng/mL      - Increased likelihood sepsis; Antibiotics encouraged.  >2.0 ng/mL      - High risk of sepsis/septic shock; Antibiotics strongly  encouraged.    Decisions on antibiotic use should not be based solely on procalcitonin  levels. If antibiotics are administered, repeat procalcitonin testing should  be obtained  every 2-3 days to consider early antibiotic cessation. PCT is a  dynamic biomarker and most useful when trends are analyzed over time in  accompaniment with o                           ther clinical data. Interpretation should be based upon  clinical context and algorithms available on the Antimicrobial Stewardship  Website (www.nebraskamed.com/asp).  Total Protein 09/26/2022 6.9  6.4 - 8.3 g/dL Final    Albumin 09/26/2022 3.4 (L)  3.5 - 5.2 g/dL Final    Total Bilirubin 09/26/2022 0.19  0.00 - 1.20 mg/dL Final    Alk Phosphatase 09/26/2022 74  40 - 130 unit/L Final    Bilirubin, Direct 09/26/2022 <0.20  0.00 - 0.30 mg/dL Final    Comment: Bilirubin Indirect unable to be calculated because Bilirubin Direct is less  than 0.2 mg/dL.      AST 09/26/2022 34  0 - 50 unit/L Final    ALT 09/26/2022 41  0 - 50 unit/L Final    CRP 09/26/2022 1.46 (H)  0.00 - 0.50 mg/dL Final    Total Protein 09/27/2022 6.8  6.4 - 8.3 g/dL Final    Albumin 09/27/2022 3.4 (L)  3.5 - 5.2 g/dL Final    Total Bilirubin 09/27/2022 0.29  0.00 - 1.20 mg/dL Final    Alk Phosphatase 09/27/2022 66  40 - 130 unit/L Final    Bilirubin, Direct 09/27/2022 <0.20  0.00 - 0.30 mg/dL Final    Comment: Bilirubin Indirect unable to be calculated because Bilirubin Direct is less  than 0.2 mg/dL.      AST 09/27/2022 30  0 - 50 unit/L Final    ALT 09/27/2022 37  0 - 50 unit/L Final    CRP 09/27/2022 0.66 (H)  0.00 - 0.50 mg/dL Final    Hemoglobin A1C 09/27/2022 6.3 (H)  4.0 - 6.0 % Final    Comment: HEMOGLOBIN A1C INTERPRETATION:    The following arbitrary ranges may be used for interpretation of the results.  However, factors such as duration of diabetes, adherence to therapy, and  patient age should also be considered in assessing degree of blood glucose  control.    Hemoglobin A1C                 Avg. Blood Sugar  --------------------------------------------------------------  6%                           135 mg/dL  7%                           170 mg/dL  8%                            205 mg/dL  9%                           240 mg/dL  10%                          275 mg/dL    ======================================================    A1C                      Glucose Control  ----------------------------------------------------------------  < 6.0 %                   Normal  6.0 - 6.9 %               Abnormal  7.0 - 7.9 %               Sub-Optimal Control  > 8.0 %  Inadequate Control      Est. Avg. Glucose, WB 09/27/2022 134   Final    Est. Avg. Glucose-calculated 09/27/2022 147   Final    Total Protein 09/28/2022 6.7  6.4 - 8.3 g/dL Final    Albumin 09/28/2022 3.4 (L)  3.5 - 5.2 g/dL Final    Total Bilirubin 09/28/2022 0.45  0.00 - 1.20 mg/dL Final    Alk Phosphatase 09/28/2022 65  40 - 130 unit/L Final    Bilirubin, Direct 09/28/2022 <0.20  0.00 - 0.30 mg/dL Final    Comment: Bilirubin Indirect unable to be calculated because Bilirubin Direct is less  than 0.2 mg/dL.      AST 09/28/2022 44  0 - 50 unit/L Final    ALT 09/28/2022 55 (H)  0 - 50 unit/L Final    Total Protein 09/29/2022 6.5  6.4 - 8.3 g/dL Final    Albumin 09/29/2022 3.3 (L)  3.5 - 5.2 g/dL Final    Total Bilirubin 09/29/2022 0.46  0.00 - 1.20 mg/dL Final    Alk Phosphatase 09/29/2022 66  40 - 130 unit/L Final    Bilirubin, Direct 09/29/2022 <0.20  0.00 - 0.30 mg/dL Final    Comment: Bilirubin Indirect unable to be calculated because Bilirubin Direct is less  than 0.2 mg/dL.      AST 09/29/2022 48  0 - 50 unit/L Final    ALT 09/29/2022 71 (H)  0 - 50 unit/L Final    Bilirubin, Indirect 09/29/2022 0.26  0.00 - 1.00 mg/dL Final    WBC 09/29/2022 8.9  3.8 - 10.6 x10e3/mcL Final    RBC 09/29/2022 5.17  4.00 - 5.60 x10e6/mcL Final    Hemoglobin 09/29/2022 15.4  13.0 - 17.3 g/dL Final    Hematocrit 09/29/2022 50.8  38.0 - 52.0 % Final    MCV 09/29/2022 98.3  84.0 - 100.0 fL Final    MCH 09/29/2022 29.8  27.0 - 34.5 pg Final    MCHC 09/29/2022 30.3 (L)  32.0 - 36.0 g/dL Final    RDW 09/29/2022 13.6   11.0 - 16.0 % Final    Platelets 09/29/2022 173  140 - 440 x10e3/mcL Final    MPV 09/29/2022 10.1  7.2 - 13.2 fL Final    NRBC Automated 09/29/2022 0.0  0.0 - 0.2 % Final    NRBC Absolute 09/29/2022 0.000  0.000 - 0.012 x10e3/mcL Final    Neutrophils % 09/29/2022 77.5 (H)  42.0 - 74.0 % Final    Lymphocytes 09/29/2022 14.7 (L)  15.0 - 45.0 % Final    Monocytes 09/29/2022 7.2  4.0 - 12.0 % Final    Eosinophils % 09/29/2022 0.1  0.0 - 7.0 % Final    Basophils % 09/29/2022 0.1  0.0 - 2.0 % Final    Neutrophils Absolute 09/29/2022 6.9  1.6 - 7.3 x10e3/mcL Final    Absolute Lymph # 09/29/2022 1.3  1.0 - 3.2 x10e3/mcL Final    Absolute Mono # 09/29/2022 0.6  0.3 - 1.0 x10e3/mcL Final    Absolute Eos # 09/29/2022 0.0  0.0 - 0.5 x10e3/mcL Final    Absolute Baso # 09/29/2022 0.0  0.0 - 0.2 x10e3/mcL Final    Immature Granulocytes 09/29/2022 0.4  0.0 - 0.6 % Final    Immature Grans (Abs) 09/29/2022 0.04  0.00 - 0.06 x10e3/mcL Final    Sodium 09/29/2022 141  135 - 145 mmol/L Final    Potassium 09/29/2022 4.3  3.5 - 5.3 mmol/L Final    Chloride 09/29/2022  100  98 - 107 mmol/L Final    CO2 09/29/2022 33 (H)  22 - 29 mmol/L Final    Glucose 09/29/2022 113 (H)  70 - 99 mg/dL Final    BUN 09/29/2022 19  6 - 20 mg/dL Final    Creatinine 09/29/2022 0.7  0.7 - 1.3 mg/dL Final    Anion Gap 09/29/2022 8  2 - 17 mmol/L Final    OSMOLALITY CALCULATED 09/29/2022 284  270 - 287 mOsm/kg Final    Calcium 09/29/2022 8.1 (L)  8.6 - 10.0 mg/dL Final    Est, Glom Filt Rate 09/29/2022 110  >=60 mL/min/1.76mFinal    Comment: VERIFIED by Discern Expert.  GFR Interpretation:                                                                         % OF  KIDNEY  GFR                                                        STAGE  FUNCTION  ==================================================================================    > 90        Normal kidney function                       STAGE 1  90-100%  89 to 60      Mild loss of kidney function                  STAGE 2  80-60%  59 to 45      Mild to moderate loss of kidney function     STAGE 3a  59-45%  44 to 30      Moderate to severe loss of kidney function   STAGE 3b  44-30%  29 to 15      Severe loss of kidney function               STAGE 4  29-15%    < 15        Kidney failure                               STAGE 5  <15%  ==================================================================================  Modified from [place]   GFR Calculation performed using the CKD-EPI 2021 equation developed for use  with IDMS traceable creatinine methods and                            is the calculation recommended by  the [facility] estimating GFR in adults.        No results found for this visit on 11/24/22.              An electronic signature was used to authenticate this note.    --SMarin Roberts MD

## 2022-11-24 NOTE — Assessment & Plan Note (Signed)
Will continue improvement with diet, low carbs, would recommend increasing walking exercise.  Will need lab follow-up within the next 3 months as well.  Although he may be establishing with a new permanent PCP.  No medication at this time.

## 2022-11-24 NOTE — Assessment & Plan Note (Signed)
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  He has been stable on current medication regimen.  This includes olanzapine, lamotrigine, buspirone and fluoxetine.  Denies any side effects on his current regimen.  Denies any symptoms of serotonin type syndrome.  Patient is now following with the [facility].  He will need a refill however on the buspirone currently.

## 2022-11-24 NOTE — Assessment & Plan Note (Signed)
Reviewed plan; Much of patient's chronic medical problems such as the hyperglycemia hyperlipidemia are related to the morbid obesity.  Long-term issues regarding potential weight gain dietary issues and whether patient would be a candidate for interventional procedure and bariatric surgery should be discussed as well going forward.  Patient likely will be establishing with a new PCP since he is on a new insurance plan

## 2023-02-18 ENCOUNTER — Encounter

## 2023-02-18 MED ORDER — ATORVASTATIN CALCIUM 10 MG PO TABS
10 MG | ORAL_TABLET | Freq: Every day | ORAL | 0 refills | Status: DC
Start: 2023-02-18 — End: 2023-03-24

## 2023-02-18 MED ORDER — OMEPRAZOLE 20 MG PO CPDR
20 MG | ORAL_CAPSULE | Freq: Every day | ORAL | 0 refills | Status: DC
Start: 2023-02-18 — End: 2023-03-24

## 2023-02-23 ENCOUNTER — Encounter

## 2023-03-16 ENCOUNTER — Encounter

## 2023-03-20 ENCOUNTER — Inpatient Hospital Stay: Admit: 2023-03-20 | Payer: MEDICAID | Primary: Physician Assistant

## 2023-03-20 DIAGNOSIS — R197 Diarrhea, unspecified: Secondary | ICD-10-CM

## 2023-03-20 MED ORDER — IOPAMIDOL 61 % IV SOLN
61 | Freq: Once | INTRAVENOUS | Status: AC | PRN
Start: 2023-03-20 — End: 2023-03-20
  Administered 2023-03-20: 19:00:00 100 mL via INTRAVENOUS

## 2023-03-24 ENCOUNTER — Encounter

## 2023-03-24 MED ORDER — OMEPRAZOLE 20 MG PO CPDR
20 | ORAL_CAPSULE | Freq: Every day | ORAL | 3 refills | Status: AC
Start: 2023-03-24 — End: ?

## 2023-03-24 MED ORDER — ATORVASTATIN CALCIUM 10 MG PO TABS
10 | ORAL_TABLET | Freq: Every day | ORAL | 3 refills | Status: AC
Start: 2023-03-24 — End: ?

## 2023-06-15 NOTE — Progress Notes (Signed)
 Pre Procedure Patient Instructions     Procedure Location hospital:[facility]: [address] [place] - Drop off at Outpatient Services to the left of the main hospital entrance and proceed to the gold elevator on your left (past the security desk) to the 2nd floor Surgery Reception desk.   Procedure Date 06/28/23  Arrival Time  7:30    Your doctor determines your scheduled start time.  Depending on the facility where your procedure is taking place and the scheduled start time, you may be required to arrive up to 2 hours prior.  This is to allow time for registration, your preop assessment, any day of procedure testing and to meet with your care teams members.  This also allows your procedure to be performed earlier should there be a cancellation that day.  We appreciate your patience as we work to provide you with excellent service.    Medications:  Medication to be taken the morning of surgery with a few sips of water  only: BUSPIRONE , ALBUTEROL  IF NEEDED, OMEPRAZOLE         Stop all supplements, vitamins and herbal remedies one week prior to your procedure, unless your doctor told you to continue taking.  Do not take over the counter pain medications except plain Tylenol  or Acetaminophen  for seven days prior to your procedure unless your doctor told you otherwise.  If you are taking any diabetes and or weight loss medications (including injectables) not discussed during the Pre Admission Testing call, it is important to call [phone] to discuss important instructions.  If you are taking blood thinners, and have not been told when to hold the dose, call the doctor performing your procedure and prescribing physician for instructions on when to stop.        Procedure Preparation    Diet Restrictions:Follow the prep instructions from your doctor      If you have questions on the day of your procedure call [facility] at [phone].    Skin Preparation:     Do not put on any deodorants, lotions, powders, or oils on the day of procedure.  Be sure to put on clean, comfortable loose fitting clothing.    Other Preparation:  Call your doctor right away if you have any fever, cold or flu symptoms  Bowel prep as instructed by your doctor    No test required before your procedure.          Day of Procedure Patient Instruction:  Do not smoke, vape, chew tobacco, drink alcohol or use recreational drugs on the day of your procedure  Remove all jewelry, piercings, and metal accessories  Do not wear artificial nails and only clear nail polish on natural nails. Nails must be trimmed to fingertip length to make sure the oxygen probe fits properly and to avoid injury  Do not wear artificial eye lashes because they can cause dry eyes, eye scratches, eye infections and allergic reactions  Do not use hair extensions with metal clips or hairstyles near the back of your neck, as it can make it difficult to safely manage your breathing during anesthesia  If your hair is tightly braided or styled in a complex way, it may need to be undone for your safety  Do not wear contacts, tampons, make-up, lotions, creams, powders, fragrances or deodorant  Wear loose fit clothing  Do not bring valuables or money  Bring a copy of your Living Will and/or Medical Durable Power of  Attorney if you have one  Bring a list of current medications including name and dosage  Bring a picture ID and insurance card     Bring a walker or other orthopedic device necessary for postop  Bring a storage case for eyeglasses, hearing aids, dentures, etc  Refer to the doctor's office for day of procedure recommended clothing          If you are going home the same day as your procedure, a support person should accompany you to the facility and must transport you home.  If you plan to take public transportation of any sort, your support person must accompany you home.  You should have someone to stay with you for 24 hours  after your procedure with sedation of any kind.       Comments:     This information was reviewed with you during your Pre-Admission Testing interview and you verbalized understanding. If you have any additional questions please contact [phone].    To pre-register for your procedure please call [phone] Option 2.    For financial questions regarding your procedure at a [facility] facility, please contact [phone].    For financial questions regarding anesthesia at a [facility] facility, please  contact [phone].    For MyChart Patient Portal help please call [phone].    For hotel accommodations please visit [email or web address] and select PATIENTS &VISITORS -> "FOR VISITORS" section and then TRAVEL INFORMATION -> PLACES TO STAY

## 2023-06-28 ENCOUNTER — Inpatient Hospital Stay: Payer: Medicaid (Managed Care) | Attending: Gastroenterology

## 2023-06-28 MED ORDER — SODIUM CHLORIDE 0.9 % IV SOLN
0.9 | INTRAVENOUS | Status: DC | PRN
Start: 2023-06-28 — End: 2023-06-28

## 2023-06-28 MED ORDER — SODIUM CHLORIDE 0.9 % IV SOLN
0.9 | INTRAVENOUS | Status: DC
Start: 2023-06-28 — End: 2023-06-28

## 2023-06-28 MED ORDER — NORMAL SALINE FLUSH 0.9 % IV SOLN
0.9 | INTRAVENOUS | Status: DC | PRN
Start: 2023-06-28 — End: 2023-06-28

## 2023-06-28 MED ORDER — LIDOCAINE HCL (PF) 2 % IJ SOLN
2 | Freq: Once | INTRAMUSCULAR | Status: DC | PRN
Start: 2023-06-28 — End: 2023-06-28

## 2023-06-28 MED ORDER — PROPOFOL 200 MG/20ML IV EMUL
200 | INTRAVENOUS | Status: AC
Start: 2023-06-28 — End: ?

## 2023-06-28 MED ORDER — NORMAL SALINE FLUSH 0.9 % IV SOLN
0.9 | Freq: Two times a day (BID) | INTRAVENOUS | Status: DC
Start: 2023-06-28 — End: 2023-06-28

## 2023-06-28 MED ORDER — PROPOFOL 200 MG/20ML IV EMUL
200 | Freq: Once | INTRAVENOUS | Status: DC | PRN
Start: 2023-06-28 — End: 2023-06-28

## 2023-06-28 MED ORDER — LIDOCAINE HCL (PF) 2 % IJ SOLN
2 | INTRAMUSCULAR | Status: AC
Start: 2023-06-28 — End: ?

## 2023-06-28 MED ORDER — LIDOCAINE HCL (PF) 1 % IJ SOLN
1 | Freq: Once | INTRAMUSCULAR | Status: AC | PRN
Start: 2023-06-28 — End: 2023-06-28

## 2023-06-28 MED ORDER — LACTATED RINGERS IV SOLN
INTRAVENOUS | Status: DC
Start: 2023-06-28 — End: 2023-06-28

## 2023-06-28 MED ADMIN — propofol infusion: 200 | INTRAVENOUS | @ 13:00:00 | NDC 63323026925

## 2023-06-28 MED ADMIN — lidocaine PF 1 % injection 0.1 mL: 0.1 mL | INTRADERMAL | @ 13:00:00 | NDC 55150016102

## 2023-06-28 MED ADMIN — lidocaine PF 2 % injection: 100 | INTRAVENOUS | @ 13:00:00 | NDC 63323049504

## 2023-06-28 MED ADMIN — lactated ringers IV soln infusion: INTRAVENOUS | @ 13:00:00 | NDC 00338011704

## 2023-06-28 MED FILL — XYLOCAINE-MPF 1 % IJ SOLN: 1 % | INTRAMUSCULAR | Qty: 2

## 2023-06-28 MED FILL — DIPRIVAN 200 MG/20ML IV EMUL: 200 MG/20ML | INTRAVENOUS | Qty: 20

## 2023-06-28 MED FILL — XYLOCAINE-MPF 2 % IJ SOLN: 2 % | INTRAMUSCULAR | Qty: 5

## 2023-06-28 NOTE — H&P (Signed)
 Attending History and Physical      CHIEF COMPLAINT:      Diarrhea, Abdominal Cramping & Blood in Stool, Screening for malignant neoplasms of the colon          Reason for Admission: Screening for malignant neoplasms of the colon    History Obtained From:  patient    HISTORY OF PRESENT ILLNESS:      Richard Dennis is a 57 year old  male who is being seen today as an initial consultation for issues of 3+ episodes of loose watery stools daily. He is seeing bright red blood on the toilet paper after having bowel movements. He is having excessive gas, bloating complaints as well. His has a stable diet and his weight has been stable.       Past Medical History:        Diagnosis Date    Anxiety and depression     Asthma     Hyperlipidemia     Obesity      Past Surgical History:        Procedure Laterality Date    BICEPS TENDON REPAIR Right     CARPAL TUNNEL RELEASE Bilateral     CERVICAL SPINE SURGERY      x 3 fusions    CHOLECYSTECTOMY, LAPAROSCOPIC      LUMBAR SPINE SURGERY      x 2 fusions    SHOULDER ARTHROSCOPY Right        Medications Prior to Admission:    Medications Prior to Admission: atorvastatin  (LIPITOR ) 10 MG tablet, TAKE 1 TABLET BY MOUTH EVERY DAY  omeprazole  (PRILOSEC) 20 MG delayed release capsule, TAKE 1 CAPSULE BY MOUTH EVERY DAY (Patient taking differently: Take 1 capsule by mouth Daily)  busPIRone  (BUSPAR ) 10 MG tablet, Take 1 tablet by mouth 2 times daily  Magnesium  400 MG CAPS, Take 400 capsules by mouth every morning  OLANZapine  (ZYPREXA ) 15 MG tablet, Take 1 tablet by mouth nightly  albuterol  sulfate HFA (PROVENTIL ;VENTOLIN ;PROAIR ) 108 (90 Base) MCG/ACT inhaler, Inhale 2 puffs into the lungs every 4 hours as needed for Wheezing  FLUoxetine  (PROZAC ) 10 MG capsule, Take 1 capsule by mouth daily  lamoTRIgine  (LAMICTAL ) 150 MG tablet, Take 1 tablet by mouth daily    Allergies:  Patient has no known allergies.      Family History:   History reviewed. No pertinent family history.  REVIEW OF  SYSTEMS:  CONSTITUTIONAL:  negative  PHYSICAL EXAM:    Vitals:  Ht 1.753 m (5' 9)   Wt (!) 145.2 kg (320 lb)   BMI 47.26 kg/m     ABDOMEN:  No scars, normal bowel sounds, soft, non-distended, non-tender, no masses palpated, no hepatosplenomegally    DATA:  Old records have been requested  ASSESSMENT AND PLAN:      Active Problems:  Screening for malignant neoplasms of the colon    Plan:   Proceed with routine colonoscopy exam

## 2023-06-28 NOTE — Anesthesia Postprocedure Evaluation (Signed)
 Department of Anesthesiology  Postprocedure Note    Patient: Richard Dennis  MRN: [id number]  Birthdate: 1966/05/31  Date of evaluation: 06/28/2023    Procedure Summary       Date: 06/28/23 Room / Location: [facility] / [facility]    Anesthesia Start: 0853 Anesthesia Stop: 0914    Procedure: COLONOSCOPY BIOPSY Diagnosis:       Screening for colon cancer      (Screening for colon cancer [Z12.11])    Surgeons: Ladean Carliss Rush, MD Responsible Provider: Gareth Rush Pitts, MD    Anesthesia Type: TIVA ASA Status: 3            Anesthesia Type: TIVA    Aldrete Phase I:      Aldrete Phase II: Aldrete Score: 10    Anesthesia Post Evaluation    Patient location during evaluation: PACU  Level of consciousness: awake and alert  Airway patency: patent  Nausea & Vomiting: no nausea and no vomiting  Cardiovascular status: blood pressure returned to baseline  Respiratory status: acceptable  Hydration status: stable  Comments: Vitals reviewed  Pain management: adequate    No notable events documented.

## 2023-06-28 NOTE — Op Note (Signed)
 Procedure: Colonoscopy    Indication: Screening for malignant neoplasms of the colon    ASA Grade: 3    Medications: IV Propofol  per anesthesia.    Procedure:  The patient presents for a diagnostic colonoscopy. Prior to the procedure, a History and Physical was performed, and patient medications and allergies were reviewed. The patients tolerance of previous anesthesia was also reviewed. The procedure, indications, preparation and potential complications were explained to the patient, who indicated understanding and signed the corresponding consent forms. After reviewing the risks and benefits, the patient was deemed in satisfactory condition to undergo the procedure. Appropriate time-out protocol was followed: the correct patient, the correct procedure, and the correct equipment in the room were confirmed. Patient was placed in the left lateral decubitus position. Continuous pulse oximetry and blood pressure monitoring were used throughout the procedure. Supplemental oxygen was administered. IV general anesthesia was provided by an anesthesiologist.  The colonoscope was then introduced through the rectum and advanced under direct visualization until the cecum and terminal ileum was reached. The appendiceal orifice and the ileo-cecal valve were identified. Careful visualization was performed as the instrument was withdrawn.The colonoscope was retroflexed within the rectum. Quality of prep was excellent. Patient tolerance to procedure was excellent. Estimated blood loss: none    Limitations/Complications: There were no apparent limitations or complications.    Findings:  Colonoscopy:  - The cecum appeared normal.  - The ascending colon appeared normal.  - The transverse colon appeared normal.  - The descending colon appeared normal.  - The sigmoid colon appeared normal.  - The rectum appeared normal.    Random colon biopsies were taken using cold biopsy forceps to rule out microscopic colitis.    Impression:  Normal  colonic mucosa.  Given patient's history of diarrhea I did obtain random biopsies from throughout the colon to rule out microscopic colitis.    Plan:  - F/u pathology   - Repeat routine colonoscopy in 10 years  - Continue home medications as previously taking.  - Continue regular diet.  - Discharge to home when standard parameters are met.    Pathology:  To be reviewed once available.

## 2023-06-28 NOTE — Anesthesia Pre Procedure (Signed)
 Department of Anesthesiology  Preprocedure Note       Name:  Richard Dennis   Age:  57 y.o.  DOB:  Jul 16, 1966                                          MRN:  [id number]         Date:  06/28/2023      Surgeon: Clotilde):  Feussner, Carliss Rush, MD    Procedure: Procedure(s):  COLONOSCOPY DIAGNOSTIC    Medications prior to admission:   Prior to Admission medications    Medication Sig Start Date End Date Taking? Authorizing Provider   atorvastatin  (LIPITOR ) 10 MG tablet TAKE 1 TABLET BY MOUTH EVERY DAY 03/24/23  Yes Lumpkin, Jeanne B, MD   omeprazole  (PRILOSEC) 20 MG delayed release capsule TAKE 1 CAPSULE BY MOUTH EVERY DAY  Patient taking differently: Take 1 capsule by mouth Daily 03/24/23  Yes Lumpkin, Jeanne B, MD   busPIRone  (BUSPAR ) 10 MG tablet Take 1 tablet by mouth 2 times daily 11/24/22  Yes Parish, Jayson POUR, MD   Magnesium  400 MG CAPS Take 400 capsules by mouth every morning   Yes [provider]   OLANZapine  (ZYPREXA ) 15 MG tablet Take 1 tablet by mouth nightly 10/22/22  Yes Ima Jayson POUR, MD   albuterol  sulfate HFA (PROVENTIL ;VENTOLIN ;PROAIR ) 108 (90 Base) MCG/ACT inhaler Inhale 2 puffs into the lungs every 4 hours as needed for Wheezing 09/30/22  Yes Carnes, Tinnie Agreste, PA-C   FLUoxetine  (PROZAC ) 10 MG capsule Take 1 capsule by mouth daily 09/30/22  Yes Carnes, Lauren Ashton, PA-C   lamoTRIgine  (LAMICTAL ) 150 MG tablet Take 1 tablet by mouth daily 09/30/22   Carnes, Tinnie Agreste, PA-C       Current medications:    Current Facility-Administered Medications   Medication Dose Route Frequency Provider Last Rate Last Admin   . sodium chloride  flush 0.9 % injection 5-40 mL  5-40 mL IntraVENous 2 times per day Ladean Carliss Rush, MD       . sodium chloride  flush 0.9 % injection 5-40 mL  5-40 mL IntraVENous PRN Feussner, Carliss Rush, MD       . 0.9 % sodium chloride  infusion  25 mL IntraVENous PRN Feussner, Carliss Rush, MD       . 0.9 % sodium chloride  infusion   IntraVENous Continuous Feussner, Carliss Rush, MD        . lactated ringers  IV soln infusion   IntraVENous Continuous Ladean Carliss Rush, MD 125 mL/hr at 06/28/23 0830 New Bag at 06/28/23 0830       Allergies:  No Known Allergies    Problem List:    Patient Active Problem List   Diagnosis Code   . Asthma J45.909   . Obesity, morbid, BMI 40.0-49.9 E66.01   . Dyslipidemia E78.5   . Hyperglycemia R73.9   . Prediabetes R73.03   . Bipolar 1 disorder, depressed, partial remission (HCC) F31.75   . Hypercholesteremia E78.00   . Snoring R06.83   . GERD without esophagitis K21.9   . Impaired glucose tolerance R73.02   . S/P lumbar fusion Z98.1       Past Medical History:        Diagnosis Date   . Anxiety and depression    . Asthma    . Hyperlipidemia    . Obesity        Past  Surgical History:        Procedure Laterality Date   . BICEPS TENDON REPAIR Right    . CARPAL TUNNEL RELEASE Bilateral    . CERVICAL SPINE SURGERY      x 3 fusions   . CHOLECYSTECTOMY, LAPAROSCOPIC     . LUMBAR SPINE SURGERY      x 2 fusions   . SHOULDER ARTHROSCOPY Right        Social History:    Social History     Tobacco Use   . Smoking status: Former     Current packs/day: 0.00     Average packs/day: 1 pack/day for 20.0 years (20.0 ttl pk-yrs)     Types: Cigarettes     Start date: 08/2002     Quit date: 08/2022     Years since quitting: 0.8   . Smokeless tobacco: Never   Substance Use Topics   . Alcohol use: Not Currently     Comment: quit in 2003                                Counseling given: Not Answered      Vital Signs (Current):   Vitals:    06/15/23 1314 06/28/23 0815   BP:  137/82   Pulse:  71   Resp:  16   Temp:  98.4 F (36.9 C)   TempSrc:  Oral   SpO2:  92%   Weight: (!) 145.2 kg (320 lb) 135.8 kg (299 lb 6.2 oz)   Height: 1.753 m (5' 9) 1.727 m (5' 8)                                              BP Readings from Last 3 Encounters:   06/28/23 137/82   11/24/22 110/76   10/22/22 104/82       NPO Status: Time of last liquid consumption: 0530                        Time of last solid  consumption: 2330                        Date of last liquid consumption: 06/28/23                        Date of last solid food consumption: 06/26/23    BMI:   Wt Readings from Last 3 Encounters:   06/28/23 135.8 kg (299 lb 6.2 oz)   11/24/22 (!) 150.6 kg (332 lb)   10/22/22 (!) 150.4 kg (331 lb 9.6 oz)     Body mass index is 45.52 kg/m.    CBC:   Lab Results   Component Value Date/Time    WBC 8.9 09/29/2022 07:18 AM    RBC 5.17 09/29/2022 07:18 AM    HGB 15.4 09/29/2022 07:18 AM    HCT 50.8 09/29/2022 07:18 AM    MCV 98.3 09/29/2022 07:18 AM    RDW 13.6 09/29/2022 07:18 AM    PLT 173 09/29/2022 07:18 AM       CMP:   Lab Results   Component Value Date/Time    NA 141 09/29/2022 07:18 AM    K 4.3 09/29/2022 07:18 AM  CL 100 09/29/2022 07:18 AM    CO2 33 09/29/2022 07:18 AM    BUN 19 09/29/2022 07:18 AM    CREATININE 0.7 09/29/2022 07:18 AM    LABGLOM 110 09/29/2022 07:18 AM    GLUCOSE 113 09/29/2022 07:18 AM    CALCIUM  8.1 09/29/2022 07:18 AM    BILITOT 0.46 09/29/2022 07:18 AM    ALKPHOS 66 09/29/2022 07:18 AM    AST 48 09/29/2022 07:18 AM    ALT 71 09/29/2022 07:18 AM       POC Tests: No results for input(s): POCGLU, POCNA, POCK, POCCL, POCBUN, POCHEMO, POCHCT in the last 72 hours.    Coags: No results found for: PROTIME, INR, APTT    HCG (If Applicable): No results found for: PREGTESTUR, PREGSERUM, HCG, HCGQUANT     ABGs: No results found for: PHART, PO2ART, PCO2ART, HCO3ART, BEART, O2SATART     Type & Screen (If Applicable):  No results found for: ABORH, LABANTI    Drug/Infectious Status (If Applicable):  No results found for: HIV, HEPCAB    COVID-19 Screening (If Applicable):   Lab Results   Component Value Date/Time    COVID19 Detected 09/24/2022 01:19 PM           Anesthesia Evaluation    Airway: Mallampati: II     Neck ROM: full     Dental: normal exam         Pulmonary:normal exam    (+)           asthma:                            Cardiovascular:Negative  CV ROS                      Neuro/Psych:   (+) psychiatric history:            GI/Hepatic/Renal:   (+) GERD:          Endo/Other: Negative Endo/Other ROS                    Abdominal:             Vascular:          Other Findings:       Anesthesia Plan      general and TIVA     ASA 3       Induction: intravenous.    MIPS: Postoperative opioids intended and Prophylactic antiemetics administered.  Anesthetic plan and risks discussed with patient.      Plan discussed with CRNA.                Norleen Lupita Pickerel, MD   06/28/2023

## 2023-12-01 ENCOUNTER — Encounter

## 2023-12-23 ENCOUNTER — Inpatient Hospital Stay: Payer: MEDICAID | Primary: Physician Assistant

## 2023-12-23 DIAGNOSIS — M5459 Other low back pain: Secondary | ICD-10-CM

## 2024-01-02 ENCOUNTER — Inpatient Hospital Stay: Admit: 2024-01-02 | Payer: MEDICAID | Primary: Physician Assistant

## 2024-01-02 DIAGNOSIS — M5459 Other low back pain: Secondary | ICD-10-CM

## 2024-01-06 ENCOUNTER — Ambulatory Visit
Admit: 2024-01-06 | Discharge: 2024-01-06 | Payer: MEDICAID | Attending: Physician Assistant | Primary: Physician Assistant

## 2024-01-06 VITALS — Ht 68.0 in | Wt 299.0 lb

## 2024-01-06 DIAGNOSIS — M5416 Radiculopathy, lumbar region: Secondary | ICD-10-CM

## 2024-01-06 NOTE — Progress Notes (Signed)
 Richard Dennis   DOB:1965-10-04   Patient presents today with complaints of lower back pain that radiates down his right thigh  Symptoms presented two months ago   How did your current pain start-sudden onset   Describes pain as aching, stabbing   Pain increases with walking, sitting   Pain decreases with medications  Pain level today is 8/10  Patient admits  weakness and radiating pain. Denies numbness and tingling   Patient denies recent Conservative Treatment   Patient admits previous spinal surgery-Cervical Fusion in 2015 and  Lumbar Fusion x2 in 2014 and in 2015    Reason for visit:   Chief Complaint   Patient presents with    Back Pain        History of present illness: Patient is a 58 y.o. male with history of cervical fusion in 2015 and lumbar fusion x 2 in 2014 and 2015 presenting for evaluation of low back pain x 2 months.  He denies known trauma or injury.  He reports low back pain which radiates into the right hip.  He denies tingling or numbness in the legs.  He reports leg weakness. He had lumbar spine x-rays on 10/18/2023 which were unremarkable.  He also reports normal right hip x-rays, although I do not have documentation of this.  He has been evaluated by his PCP who has treated his pain with physical therapy x 6 weeks since the beginning of the year which has not provided relief.  Lumbar spine MRI was completed on 01/02/2024.  He was referred to our clinic for further evaluation.    ROS:   A complete ROS based on the patients complaint was obtained today and appropriately  documented in the HPI.    No Known Allergies    Past Medical History:   Diagnosis Date    Anxiety and depression     Asthma     Hyperlipidemia     Obesity         Past Surgical History:   Procedure Laterality Date    BICEPS TENDON REPAIR Right     CARPAL TUNNEL RELEASE Bilateral     CERVICAL SPINE SURGERY  2015    3 level ACDF    CHOLECYSTECTOMY, LAPAROSCOPIC      COLONOSCOPY N/A 06/28/2023    COLONOSCOPY BIOPSY performed by  Darden Palmer, MD at [facility] ENDOSCOPY    LUMBAR SPINE SURGERY      x 2 fusions    SHOULDER ARTHROSCOPY Right          Current Outpatient Medications:     ibuprofen (ADVIL;MOTRIN) 800 MG tablet, , Disp: , Rfl:     baclofen (LIORESAL) 10 MG tablet, 1 TABLET AS NEEDED AT BEDTIME ORALLY ONCE A DAY 10 DAYS, Disp: , Rfl:     atorvastatin (LIPITOR) 10 MG tablet, TAKE 1 TABLET BY MOUTH EVERY DAY, Disp: 90 tablet, Rfl: 3    omeprazole (PRILOSEC) 20 MG delayed release capsule, TAKE 1 CAPSULE BY MOUTH EVERY DAY, Disp: 90 capsule, Rfl: 3    busPIRone (BUSPAR) 10 MG tablet, Take 1 tablet by mouth 2 times daily, Disp: 60 tablet, Rfl: 2    Magnesium 400 MG CAPS, Take 400 capsules by mouth every morning, Disp: , Rfl:     OLANZapine (ZYPREXA) 15 MG tablet, Take 1 tablet by mouth nightly, Disp: 30 tablet, Rfl: 1    albuterol sulfate HFA (PROVENTIL;VENTOLIN;PROAIR) 108 (90 Base) MCG/ACT inhaler, Inhale 2 puffs into the lungs every 4 hours as needed  for Wheezing, Disp: 18 g, Rfl: 0    FLUoxetine (PROZAC) 10 MG capsule, Take 1 capsule by mouth daily, Disp: 30 capsule, Rfl: 1    lamoTRIgine (LAMICTAL) 150 MG tablet, Take 1 tablet by mouth daily, Disp: 30 tablet, Rfl: 1       Physical Exam:  Patient seen and examined   General: Well developed. Alert and cooperative in no acute distress.     HENT: atraumatic, neck supple  Pulmonary: unlabored respiratory effort  Cardiovascular:  Warm well perfused. No peripheral edema    Neurologic Exam:  Neurological:  Mental Status: Awake, alert, oriented x 4, speech clear and appropriate  Attention: Intact  SLR: Positive on the right  Ankle clonus:  DTRs:    Right  Left    Patella  2  2   Achilles 2 2       Musculoskeletal:   Gait: Antalgic  Assist devices: None   Spine: Lumbar paraspinal muscle tenderness L3-S1 bilaterally, right SI joint tenderness  Motor strength:    Right  Left    Hip Flex  4 5   Quadriceps 5 5   Hamstrings 5 5   Tibialis Anterior 5 5   Ankle Plantarflex.  5 5   Ext Hal Longus   5 5            Radiological Findings:  I independently reviewed and interpreted the patient's MRI today. The radiologist's report is below.    MRI lumbar spine without contrast: 01/02/24     INDICATION: "Other low back pain". Lower back and right hip pain.     COMPARISON: Abdominopelvic CT 03/20/2023     TECHNIQUE: Noncontrast sagittal and axial, T1 and T2-weighted sequences with   sagittal STIR of the lumbar spine.     FINDINGS: No worrisome bone marrow signal changes. No compression fracture.   Alignment is maintained. No abnormal signal in the visualized inferior spinal   cord with conus terminating at T12-L1. Small filar lipoma. Otherwise   unremarkable appearance of the cauda equina nerve roots. The visualized   abdominal and pelvic soft tissues are within normal limits.      L1-L2: Maintained disc spacing. No canal stenosis. Mild bilateral foraminal   stenosis.      L2-L3: Mild disc space height loss. Disc bulge. No canal stenosis. Mild to   moderate left foraminal stenosis.      L3-L4: Maintained disc spacing. Disc bulge. Prior laminotomies. No canal   stenosis. Moderate right and mild to moderate left foraminal stenosis.      L4-L5: Prior laminectomies with bilateral transpedicular and interbody fusion.   No canal stenosis. Mild left foraminal narrowing.      L5-S1: Maintained disc spacing. Mild to moderate facet arthrosis. No stenosis.      IMPRESSION:  1.  No canal stenosis or high-grade foraminal narrowing.  2.  L4-5, prior laminectomies and fusion.  3.  L3-4, prior laminotomies. Moderate right foraminal narrowing.    Assessment:   Diagnosis Orders   1. Lumbar radiculopathy  Ambulatory referral to [facility]icial Medicine Rehab      2. Lumbar foraminal stenosis  Ambulatory referral to [facility]icial Medicine Rehab      3. Lumbar facet arthropathy             Plan:  Patient is a 58 y.o. male with history of cervical fusion in 2015 and lumbar fusion x 2 in 2014 and 2015 presenting for evaluation of low back pain x 2  months. He  reports low back pain which radiates into the right hip.  Lumbar spine x-rays on 10/18/2023 were unremarkable.  Right hip x-rays were normal, per patient.  Recent conservative treatment includes 6 weeks of physical therapy with Claretha Cooper [facility] which he completed over the past 3 months.  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  Recommend LESI.  I will refer him to Dr. Lendell Caprice today.  If no relief, consider SI joint injection versus referral to orthopedics for hip evaluation.  He will follow-up 2 weeks after his injection.      Freda Munro, PA-C  [facility]    Electronically signed by Freda Munro, PA-C on 01/06/2024 at 2:39 PM

## 2024-01-06 NOTE — H&P (View-Only) (Signed)
 Richard Dennis   DOB:1965-10-04   Patient presents today with complaints of lower back pain that radiates down his right thigh  Symptoms presented two months ago   How did your current pain start-sudden onset   Describes pain as aching, stabbing   Pain increases with walking, sitting   Pain decreases with medications  Pain level today is 8/10  Patient admits  weakness and radiating pain. Denies numbness and tingling   Patient denies recent Conservative Treatment   Patient admits previous spinal surgery-Cervical Fusion in 2015 and  Lumbar Fusion x2 in 2014 and in 2015    Reason for visit:   Chief Complaint   Patient presents with    Back Pain        History of present illness: Patient is a 58 y.o. male with history of cervical fusion in 2015 and lumbar fusion x 2 in 2014 and 2015 presenting for evaluation of low back pain x 2 months.  He denies known trauma or injury.  He reports low back pain which radiates into the right hip.  He denies tingling or numbness in the legs.  He reports leg weakness. He had lumbar spine x-rays on 10/18/2023 which were unremarkable.  He also reports normal right hip x-rays, although I do not have documentation of this.  He has been evaluated by his PCP who has treated his pain with physical therapy x 6 weeks since the beginning of the year which has not provided relief.  Lumbar spine MRI was completed on 01/02/2024.  He was referred to our clinic for further evaluation.    ROS:   A complete ROS based on the patients complaint was obtained today and appropriately  documented in the HPI.    No Known Allergies    Past Medical History:   Diagnosis Date    Anxiety and depression     Asthma     Hyperlipidemia     Obesity         Past Surgical History:   Procedure Laterality Date    BICEPS TENDON REPAIR Right     CARPAL TUNNEL RELEASE Bilateral     CERVICAL SPINE SURGERY  2015    3 level ACDF    CHOLECYSTECTOMY, LAPAROSCOPIC      COLONOSCOPY N/A 06/28/2023    COLONOSCOPY BIOPSY performed by  Darden Palmer, MD at [facility] ENDOSCOPY    LUMBAR SPINE SURGERY      x 2 fusions    SHOULDER ARTHROSCOPY Right          Current Outpatient Medications:     ibuprofen (ADVIL;MOTRIN) 800 MG tablet, , Disp: , Rfl:     baclofen (LIORESAL) 10 MG tablet, 1 TABLET AS NEEDED AT BEDTIME ORALLY ONCE A DAY 10 DAYS, Disp: , Rfl:     atorvastatin (LIPITOR) 10 MG tablet, TAKE 1 TABLET BY MOUTH EVERY DAY, Disp: 90 tablet, Rfl: 3    omeprazole (PRILOSEC) 20 MG delayed release capsule, TAKE 1 CAPSULE BY MOUTH EVERY DAY, Disp: 90 capsule, Rfl: 3    busPIRone (BUSPAR) 10 MG tablet, Take 1 tablet by mouth 2 times daily, Disp: 60 tablet, Rfl: 2    Magnesium 400 MG CAPS, Take 400 capsules by mouth every morning, Disp: , Rfl:     OLANZapine (ZYPREXA) 15 MG tablet, Take 1 tablet by mouth nightly, Disp: 30 tablet, Rfl: 1    albuterol sulfate HFA (PROVENTIL;VENTOLIN;PROAIR) 108 (90 Base) MCG/ACT inhaler, Inhale 2 puffs into the lungs every 4 hours as needed  for Wheezing, Disp: 18 g, Rfl: 0    FLUoxetine (PROZAC) 10 MG capsule, Take 1 capsule by mouth daily, Disp: 30 capsule, Rfl: 1    lamoTRIgine (LAMICTAL) 150 MG tablet, Take 1 tablet by mouth daily, Disp: 30 tablet, Rfl: 1       Physical Exam:  Patient seen and examined   General: Well developed. Alert and cooperative in no acute distress.     HENT: atraumatic, neck supple  Pulmonary: unlabored respiratory effort  Cardiovascular:  Warm well perfused. No peripheral edema    Neurologic Exam:  Neurological:  Mental Status: Awake, alert, oriented x 4, speech clear and appropriate  Attention: Intact  SLR: Positive on the right  Ankle clonus:  DTRs:    Right  Left    Patella  2  2   Achilles 2 2       Musculoskeletal:   Gait: Antalgic  Assist devices: None   Spine: Lumbar paraspinal muscle tenderness L3-S1 bilaterally, right SI joint tenderness  Motor strength:    Right  Left    Hip Flex  4 5   Quadriceps 5 5   Hamstrings 5 5   Tibialis Anterior 5 5   Ankle Plantarflex.  5 5   Ext Hal Longus   5 5            Radiological Findings:  I independently reviewed and interpreted the patient's MRI today. The radiologist's report is below.    MRI lumbar spine without contrast: 01/02/24     INDICATION: "Other low back pain". Lower back and right hip pain.     COMPARISON: Abdominopelvic CT 03/20/2023     TECHNIQUE: Noncontrast sagittal and axial, T1 and T2-weighted sequences with   sagittal STIR of the lumbar spine.     FINDINGS: No worrisome bone marrow signal changes. No compression fracture.   Alignment is maintained. No abnormal signal in the visualized inferior spinal   cord with conus terminating at T12-L1. Small filar lipoma. Otherwise   unremarkable appearance of the cauda equina nerve roots. The visualized   abdominal and pelvic soft tissues are within normal limits.      L1-L2: Maintained disc spacing. No canal stenosis. Mild bilateral foraminal   stenosis.      L2-L3: Mild disc space height loss. Disc bulge. No canal stenosis. Mild to   moderate left foraminal stenosis.      L3-L4: Maintained disc spacing. Disc bulge. Prior laminotomies. No canal   stenosis. Moderate right and mild to moderate left foraminal stenosis.      L4-L5: Prior laminectomies with bilateral transpedicular and interbody fusion.   No canal stenosis. Mild left foraminal narrowing.      L5-S1: Maintained disc spacing. Mild to moderate facet arthrosis. No stenosis.      IMPRESSION:  1.  No canal stenosis or high-grade foraminal narrowing.  2.  L4-5, prior laminectomies and fusion.  3.  L3-4, prior laminotomies. Moderate right foraminal narrowing.    Assessment:   Diagnosis Orders   1. Lumbar radiculopathy  Ambulatory referral to [facility] Re[facility]      2. Lumbar foraminal stenosis  Ambulatory referral to [facility] Medicine Rehab      3. Lumbar facet arthropathy             Plan:  Patient is a 58 y.o. male with history of cervical fusion in 2015 and lumbar fusion x 2 in 2014 and 2015 presenting for evaluation of low back pain x 2  months. He  reports low back pain which radiates into the right hip.  Lumbar spine x-rays on 10/18/2023 were unremarkable.  Right hip x-rays were normal, per patient.  Recent conservative treatment includes 6 weeks of physical therapy with Claretha [facility] which he completed over the past 3 months.  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  Recommend LESI.  I will refer him to Dr. Lendell Caprice today.  If no relief, consider SI joint injection versus referral to orthopedics for hip evaluation.  He will follow-up 2 weeks after his injection.      Freda Munro, PA-C  [facility]    Electronically signed by Freda Munro, PA-C on 01/06/2024 at 2:39 PM

## 2024-01-19 ENCOUNTER — Ambulatory Visit: Payer: MEDICAID | Primary: Physician Assistant

## 2024-01-25 ENCOUNTER — Inpatient Hospital Stay: Payer: Medicaid (Managed Care)

## 2024-01-25 MED ORDER — LIDOCAINE HCL (PF) 1 % IJ SOLN
1 | INTRAMUSCULAR | Status: DC | PRN
Start: 2024-01-25 — End: 2024-01-25
  Administered 2024-01-25: 15:00:00 5 via INTRADERMAL

## 2024-01-25 MED ORDER — IOPAMIDOL 41 % IJ SOLN
41 | INTRAMUSCULAR | Status: DC | PRN
Start: 2024-01-25 — End: 2024-01-25
  Administered 2024-01-25: 15:00:00 3 via INTRATHECAL

## 2024-01-25 MED ORDER — IOPAMIDOL 41 % IJ SOLN
41 | INTRAMUSCULAR | Status: AC
Start: 2024-01-25 — End: ?

## 2024-01-25 MED ORDER — METHYLPREDNISOLONE ACETATE 80 MG/ML IJ SUSP
80 | INTRAMUSCULAR | Status: AC
Start: 2024-01-25 — End: ?

## 2024-01-25 MED ORDER — SODIUM CHLORIDE (PF) 0.9 % IJ SOLN
0.9 | INTRAMUSCULAR | Status: DC | PRN
Start: 2024-01-25 — End: 2024-01-25
  Administered 2024-01-25: 15:00:00 10

## 2024-01-25 MED ORDER — METHYLPREDNISOLONE ACETATE 80 MG/ML IJ SUSP
80 | INTRAMUSCULAR | Status: DC | PRN
Start: 2024-01-25 — End: 2024-01-25
  Administered 2024-01-25: 15:00:00 80 via EPIDURAL

## 2024-01-25 MED FILL — ISOVUE-M 200 41 % IJ SOLN: 41 % | INTRAMUSCULAR | Qty: 10 | Fill #0

## 2024-01-25 MED FILL — METHYLPREDNISOLONE ACETATE 80 MG/ML IJ SUSP: 80 MG/ML | INTRAMUSCULAR | Qty: 1 | Fill #0

## 2024-01-25 NOTE — Discharge Instructions (Signed)
 Pain Management Post Procedure Instructions Pain Management    The following post procedural instructions are for therapeutic injections such as: epidural steroid injections, facet injections, radiofrequency procedures and other pain management injections.  You may experience the following symptoms following your specific procedure:  Increased pain for a few days, which is probably due to the added pressure and irritation from the medication used in an already inflamed or irritated area.  Occasional bruising or muscle spasm related to the procedure.  If steroid medication was used during the injection, it may take one to seven days to begin working to relieve inflammation and swelling in your back or neck. The Lidocaine and/or Marcaine                 used as a numbing agent will usually wear off within 30 minutes to 6 hours.    Diet & Activity:  - Return to your regular diet.  - DO NOT drive a car or operate machinery for the rest of the day. (Unless you were told you did not receive sedation)  - You may apply a covered ice pack to the injection site, if needed-limit to 10 minutes on, followed by 10 minutes off. No heat pad/heat compress/hot pack to site for 3 days.  - Keep activity to minimum for the rest of the day.      Medicines:  - Continue home medications.  - If any of your regular medicines were stopped for this procedure, start taking them again tomorrow. (Blood thinners, motrin, aleve, aspirin )      Injection Site Care:  - Remove Band-Aid/Dressing in 24 hours and resume your normal hygiene routine tomorrow.  - Call your doctor if you experience excessive bleeding or drainage from injection site or if you have any signs/symptoms of infection such as pain and redness, swelling or odor at the site  Contact your physician immediately:  -Fever greater than 101 degrees F  -Increasing weakness or numbness in extremity ir change in bowel and/or bladder function.  -Severe persistent headache  -Contact your  physician if any problems occur or if questions arise after your departure from our facility      Call Dr. Lendell Caprice  Only if needed at [phone] IN THE CASE OF AN EMERGENCY- DIAL 911

## 2024-01-25 NOTE — Op Note (Signed)
 DATE OF PROCEDURE:   01/25/2024     PREOPERATIVE DIAGNOSIS:        Pre-Op Diagnosis Codes:      * Lumbar radiculopathy [M54.16]     POSTOPERATIVE DIAGNOSIS: Same     SURGEON / OPERATOR: Carmela Chin, MD     PROCEDURE: INTERLAMINAR EPIDURAL STEROID INJECTION WITH FLUOROSCOPY      LEVEL TREATED:   L3-4     THERAPEUTIC AGENT: 80mg  Depo Medrol  with 4mL of preservative-free normal saline, 2mL of Isovue  M-200 was used for visualization     ANESTHESIA: Local     DESCRIPTION: After obtaining informed consent, the patient was positioned on the padded procedure table in the prone position under their own power. The skin overlying the spine was prepped with antiseptic solution and draped in the usual sterile fashion. Timeout was called and agreed to by everyone in the room.     Local anesthesia was administered in the skin overlying the interspinous space. A sterile, styletted, 18G epidural needle was advanced through the anesthetized skin and into the deeper tissues with fluoroscopic guidance. The stylette was removed. The needle was advanced into the epidural space. The epidural space was identified by the loss of resistance technique after negative aspiration. Contrast material was injected under fluoroscopic observation. Contrast was observed to flow within the epidural space. There was no evidence of contrast spread intravascularly, subarachnoid, or extra-spinally. Aspiration and test injection were negative. The therapeutic agent was administered in increments. The needle was flushed and removed intact.     The patient tolerated the procedure well and recovered uneventfully in the pain center recovery room. The procedure was performed as stated above. Discharge instructions were given.      ADDENDUM:      The radiation exposure time and number of images were documented within the record.

## 2024-01-25 NOTE — Interval H&P Note (Signed)
 Update History & Physical    The patient's History and Physical of January 06, 2024 was reviewed with the patient and I examined the patient. There was no change. The surgical site was confirmed by the patient and me.     Plan: The risks, benefits, expected outcome, and alternative to the recommended procedure have been discussed with the patient. Patient understands and wants to proceed with the procedure.     Electronically signed by Carmela Chin, MD on 01/25/2024 at 10:23 AM

## 2024-02-08 ENCOUNTER — Ambulatory Visit
Admit: 2024-02-08 | Discharge: 2024-02-08 | Payer: Medicaid (Managed Care) | Attending: Physician Assistant | Primary: Physician Assistant

## 2024-02-08 VITALS — Ht 68.0 in | Wt 299.0 lb

## 2024-02-08 DIAGNOSIS — M48061 Spinal stenosis, lumbar region without neurogenic claudication: Secondary | ICD-10-CM

## 2024-02-08 NOTE — Progress Notes (Signed)
 Richard Dennis   DOB:July 09, 1966   LOV 01-06-2024 patient is a 58 year old male with history of cervical fusion in 2015 and lumbar fusion x 2 in 2014 in 2015 presenting for evaluation of low back pain x 2 months.  He reports low back pain which radiates into the right hip.  Lumbar spine x-rays on 10/18/2023 were unremarkable.  Right hip x-rays were normal, per patient.  Recent conservative treatment includes 6 weeks of physical therapy with [facility] which he completed over the past 3 months.  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  Recommend LESI.  I will refer him to Dr. Vira Grieves today.  If no relief, consider SI joint injection versus referral to orthopedics for hip evaluation.  He will follow-up 2 weeks after his injection.     Pain level today is 7/10 for right thigh and buttock pain.   LESI on 01/25/2024 that provided 90% of relief that is still holding     Reason for visit:   Chief Complaint   Patient presents with    Follow-up        History of present illness: Patient is a 58 y.o. male presenting for injection follow-up.  He had an LESI on 01/25/2024 that provided 90% relief that is still lasting.  He reports only minimal relief pain remaining in his low back.  His main complaint today is a "muscle type pain" in the right thigh and pain and "clicking" in his right hip.  He is requesting evaluation with orthopedics.    Office visit 01/06/24: Patient is a 59 y.o. male with history of cervical fusion in 2015 and lumbar fusion x 2 in 2014 and 2015 presenting for evaluation of low back pain x 2 months.  He denies known trauma or injury.  He reports low back pain which radiates into the right hip.  He denies tingling or numbness in the legs.  He reports leg weakness. He had lumbar spine x-rays on 10/18/2023 which were unremarkable.  He also reports normal right hip x-rays, although I do not have documentation of this.  He has been evaluated by his PCP who has  treated his pain with physical therapy x 6 weeks since the beginning of the year which has not provided relief.  Lumbar spine MRI was completed on 01/02/2024.  He was referred to our clinic for further evaluation.       ROS:   A complete ROS based on the patients complaint was obtained today and appropriately  documented in the HPI.    No Known Allergies    Past Medical History:   Diagnosis Date    Anxiety and depression     Asthma     Hyperlipidemia     Obesity         Past Surgical History:   Procedure Laterality Date    BICEPS TENDON REPAIR Right     CARPAL TUNNEL RELEASE Bilateral     CERVICAL SPINE SURGERY  2015    3 level ACDF    CHOLECYSTECTOMY, LAPAROSCOPIC      COLONOSCOPY N/A 06/28/2023    COLONOSCOPY BIOPSY performed by Raelyn Bulls, MD at [facility] ENDOSCOPY    LUMBAR SPINE SURGERY      x 2 fusions    PAIN MANAGEMENT PROCEDURE N/A 01/25/2024    LESI performed by Carmela Chin, MD at [facility] AMBULATORY OR    SHOULDER ARTHROSCOPY Right  Current Outpatient Medications:     ibuprofen (ADVIL;MOTRIN) 800 MG tablet, , Disp: , Rfl:     baclofen (LIORESAL) 10 MG tablet, 1 TABLET AS NEEDED AT BEDTIME ORALLY ONCE A DAY 10 DAYS, Disp: , Rfl:     atorvastatin  (LIPITOR ) 10 MG tablet, TAKE 1 TABLET BY MOUTH EVERY DAY, Disp: 90 tablet, Rfl: 3    omeprazole  (PRILOSEC) 20 MG delayed release capsule, TAKE 1 CAPSULE BY MOUTH EVERY DAY, Disp: 90 capsule, Rfl: 3    busPIRone  (BUSPAR ) 10 MG tablet, Take 1 tablet by mouth 2 times daily, Disp: 60 tablet, Rfl: 2    Magnesium  400 MG CAPS, Take 400 capsules by mouth every morning, Disp: , Rfl:     OLANZapine  (ZYPREXA ) 15 MG tablet, Take 1 tablet by mouth nightly, Disp: 30 tablet, Rfl: 1    albuterol  sulfate HFA (PROVENTIL ;VENTOLIN ;PROAIR ) 108 (90 Base) MCG/ACT inhaler, Inhale 2 puffs into the lungs every 4 hours as needed for Wheezing, Disp: 18 g, Rfl: 0    FLUoxetine  (PROZAC ) 10 MG capsule, Take 1 capsule by mouth daily, Disp: 30 capsule, Rfl: 1     lamoTRIgine  (LAMICTAL ) 150 MG tablet, Take 1 tablet by mouth daily, Disp: 30 tablet, Rfl: 1       Physical Exam:  Patient seen and examined   General: Well developed. Alert and cooperative in no acute distress.     HENT: atraumatic, neck supple  Pulmonary: unlabored respiratory effort  Cardiovascular:  Warm well perfused. No peripheral edema    Neurologic Exam:  Neurological:  Mental Status: Awake, alert, oriented x 4, speech clear and appropriate  Attention: Intact    Musculoskeletal:   Gait: antalgic  Assist devices: None       Radiological Findings:  None since last visit    Assessment/Plan:   Diagnosis Orders   1. Lumbar foraminal stenosis        2. Pain in right hip  [facility] - [place]ott, Jaye Mettle MD, Orthopaed[facility]      3. Lumbar facet arthropathy             Patient is a 58 y.o. male presenting for injection follow-up.  He had an LESI on 01/25/2024 that provided 90% relief that is still lasting.  He reports only minimal relief pain remaining in his low back.  His main complaint today is a "muscle type pain" in the right thigh and pain and "clicking" in his right hip.  He is requesting evaluation with orthopedics.  Referral placed today.  Follow-up as needed following evaluation by Ortho.    Hays Lipschutz, PA-C  [facility]    Electronically signed by Hays Lipschutz, PA-C on 02/08/2024 at 9:18 AM

## 2024-03-21 NOTE — Telephone Encounter (Signed)
 Patient called he want to clarify if he supposed to come at 9 or 2:40 tomorrow. Called backline na

## 2024-03-22 ENCOUNTER — Ambulatory Visit: Admit: 2024-03-22 | Payer: Medicaid (Managed Care) | Primary: Physician Assistant

## 2024-03-22 ENCOUNTER — Ambulatory Visit
Admit: 2024-03-22 | Discharge: 2024-03-22 | Payer: Medicaid (Managed Care) | Attending: Physician Assistant | Primary: Physician Assistant

## 2024-03-22 VITALS — Ht 68.0 in | Wt 278.0 lb

## 2024-03-22 DIAGNOSIS — M25551 Pain in right hip: Secondary | ICD-10-CM

## 2024-03-23 ENCOUNTER — Inpatient Hospital Stay: Admit: 2024-03-23 | Discharge: 2024-03-23 | Payer: Medicaid (Managed Care) | Primary: Physician Assistant

## 2024-03-23 DIAGNOSIS — Z01818 Encounter for other preprocedural examination: Principal | ICD-10-CM

## 2024-03-23 LAB — COMPREHENSIVE METABOLIC PANEL
ALT: 26 U/L (ref 0–42)
AST: 59 U/L — ABNORMAL HIGH (ref 0–46)
Albumin/Globulin Ratio: 1.15 (ref 1.00–2.70)
Albumin: 3.8 g/dL (ref 3.5–5.2)
Alk Phosphatase: 81 U/L (ref 40–130)
Anion Gap: 14 mmol/L (ref 2–17)
BUN: 12 mg/dL (ref 6–20)
CALCIUM,CORRECTED,CCA: 9.1 mg/dL (ref 8.5–10.7)
CO2: 25 mmol/L (ref 22–29)
Calcium: 8.9 mg/dL (ref 8.5–10.7)
Chloride: 102 mmol/L (ref 98–107)
Creatinine: 1 mg/dL (ref 0.7–1.3)
Est, Glom Filt Rate: 88 mL/min/1.73mÂ² (ref >=60)
Globulin: 3.3 g/dL (ref 1.9–4.4)
Glucose: 96 mg/dL (ref 70–99)
Osmolaliy Calculated: 281 mosm/kg (ref 270–287)
Sodium: 141 mmol/L (ref 135–145)
Total Bilirubin: 0.36 mg/dL (ref 0.00–1.20)
Total Protein: 7.1 g/dL (ref 5.7–8.3)

## 2024-03-23 LAB — CBC WITH AUTO DIFFERENTIAL
Basophils %: 0.4 % (ref 0.0–2.0)
Basophils Absolute: 0 10*3/uL (ref 0.0–0.2)
Eosinophils %: 1.7 % (ref 0.0–7.0)
Eosinophils Absolute: 0.1 10*3/uL (ref 0.0–0.5)
Hematocrit: 42.6 % (ref 38.0–52.0)
Hemoglobin: 13.4 g/dL (ref 13.0–17.3)
Immature Grans (Abs): 0.01 10*3/uL (ref 0.00–0.06)
Immature Granulocytes %: 0.1 % (ref 0.0–0.6)
Lymphocytes Absolute: 2.7 10*3/uL (ref 1.0–3.2)
Lymphocytes: 33.7 % (ref 15.0–45.0)
MCH: 30.9 pg (ref 27.0–34.5)
MCHC: 31.5 g/dL (ref 30.0–36.0)
MCV: 98.2 fL (ref 84.0–100.0)
MPV: 9.8 fL (ref 7.0–12.2)
Monocytes %: 8.9 % (ref 4.0–12.0)
Monocytes Absolute: 0.7 10*3/uL (ref 0.3–1.0)
Neutrophils %: 55.2 % (ref 42.0–74.0)
Neutrophils Absolute: 4.4 10*3/uL (ref 1.6–7.3)
Platelets: 242 10*3/uL (ref 140–440)
RBC: 4.34 x10e6/mcL (ref 4.00–5.60)
RDW: 13.2 % (ref 10.0–17.0)
WBC: 7.9 10*3/uL (ref 3.8–10.6)

## 2024-03-23 NOTE — Telephone Encounter (Signed)
Patient was seen yesterday afternoon

## 2024-03-24 ENCOUNTER — Encounter

## 2024-03-24 ENCOUNTER — Inpatient Hospital Stay: Admit: 2024-03-24 | Payer: Medicaid (Managed Care) | Primary: Physician Assistant

## 2024-03-24 ENCOUNTER — Inpatient Hospital Stay: Admit: 2024-03-24 | Discharge: 2024-03-24 | Payer: Medicaid (Managed Care) | Primary: Physician Assistant

## 2024-03-24 DIAGNOSIS — Z01810 Encounter for preprocedural cardiovascular examination: Principal | ICD-10-CM

## 2024-03-24 LAB — COMPREHENSIVE METABOLIC PANEL
ALT: 26 U/L (ref 0–42)
AST: 54 U/L — ABNORMAL HIGH (ref 0–46)
Albumin/Globulin Ratio: 1.18 (ref 1.00–2.70)
Albumin: 3.9 g/dL (ref 3.5–5.2)
Alk Phosphatase: 85 U/L (ref 40–130)
Anion Gap: 12 mmol/L (ref 2–17)
BUN: 12 mg/dL (ref 6–20)
CALCIUM,CORRECTED,CCA: 8.9 mg/dL (ref 8.5–10.7)
CO2: 26 mmol/L (ref 22–29)
Calcium: 8.9 mg/dL (ref 8.5–10.7)
Chloride: 105 mmol/L (ref 98–107)
Creatinine: 0.9 mg/dL (ref 0.7–1.3)
Est, Glom Filt Rate: 100 mL/min/1.73mÂ² (ref >=60)
Globulin: 3.3 g/dL (ref 1.9–4.4)
Glucose: 102 mg/dL — ABNORMAL HIGH (ref 70–99)
Osmolaliy Calculated: 284 mosm/kg (ref 270–287)
Potassium: 4.5 mmol/L (ref 3.5–5.3)
Sodium: 143 mmol/L (ref 135–145)
Total Bilirubin: 0.31 mg/dL (ref 0.00–1.20)
Total Protein: 7.2 g/dL (ref 5.7–8.3)

## 2024-03-27 LAB — COMPREHENSIVE METABOLIC PANEL
ALT: 29 U/L (ref 0–42)
AST: 59 U/L — ABNORMAL HIGH (ref 0–46)
Albumin/Globulin Ratio: 0.95 — ABNORMAL LOW (ref 1.00–2.70)
Albumin: 4.2 g/dL (ref 3.5–5.2)
Alk Phosphatase: 95 U/L (ref 40–130)
Anion Gap: 16 mmol/L (ref 2–17)
BUN: 21 mg/dL — ABNORMAL HIGH (ref 6–20)
CALCIUM,CORRECTED,CCA: 9.8 mg/dL (ref 8.5–10.7)
CO2: 22 mmol/L (ref 22–29)
Calcium: 10 mg/dL (ref 8.5–10.7)
Chloride: 102 mmol/L (ref 98–107)
Creatinine: 1.1 mg/dL (ref 0.7–1.3)
Est, Glom Filt Rate: 79 mL/min/1.73mÂ² (ref >=60)
Globulin: 4.4 g/dL (ref 1.9–4.4)
Glucose: 108 mg/dL — ABNORMAL HIGH (ref 70–99)
Osmolaliy Calculated: 284 mosm/kg (ref 270–287)
Potassium: 4.8 mmol/L (ref 3.5–5.3)
Sodium: 140 mmol/L (ref 135–145)
Total Bilirubin: 0.27 mg/dL (ref 0.00–1.20)
Total Protein: 8.6 g/dL — ABNORMAL HIGH (ref 5.7–8.3)

## 2024-03-27 NOTE — Progress Notes (Signed)
 Koren GEANNIE Littler, Mickey., PA-C  Phone [phone]  Fax [phone]    [address] [facility]             [address]  [place], [place]  [address]  [place], [place]  [address]        Subjective:   Reason for Visit:    New Patient (RIGHT HIP, PATIENT MAY BRING COPY OF XRAY DONE AT [facility])      History of Present Illness:   Abdikadir Fohl  is a pleasant 58 y.o. male who presents today as a referral from his PCP for further evaluation of his Right hip pain. Patient states that their pain began about 6 months ago. He denies any inciting event at the time of onset of his pain. However, he notes that his pain has gradually progressed since onset. He states that this pain has become limiting, affecting his ADLs, and he has been unable to bear weight on his RLE over the last 2-3 weeks. Patient generalizes their pain about the entire aspect of his Right hip with radiation into his groin and down to his right knee. He describes his pain as constant, aching, throbbing, stabbing, and progressive in nature. His pain is worse with activity, weightbearing, bending/squatting, range of motion, and rising from a seated position. He reports trying rest, ice, OTC medications, tylenol , NSAIDs, exercise, and physical therapy with no relief. He denies any recent falls or injuries. Patient would like to discuss further treatment options of his Right hip pain today.    Review of Systems   Constitutional:  Negative for activity change, chills and fever.   HENT:  Negative for sore throat.    Eyes:  Negative for visual disturbance.   Respiratory:  Negative for cough and shortness of breath.    Cardiovascular:  Negative for chest pain.   Musculoskeletal:  Positive for arthralgias, gait problem, joint swelling and myalgias.   Skin:  Negative for rash and wound.   Neurological:  Negative for weakness and numbness.   Hematological:  Does not bruise/bleed easily.      Past Medical History:   Diagnosis Date     Anxiety and depression     Asthma     Hyperlipidemia     Obesity      Past Surgical History:   Procedure Laterality Date    BICEPS TENDON REPAIR Right     CARPAL TUNNEL RELEASE Bilateral     CERVICAL SPINE SURGERY  2015    3 level ACDF    CHOLECYSTECTOMY, LAPAROSCOPIC      COLONOSCOPY N/A 06/28/2023    COLONOSCOPY BIOPSY performed by Ladean Carliss Rush, MD at [facility]OSCOPY    LUMBAR SPINE SURGERY      x 2 fusions    PAIN MANAGEMENT PROCEDURE N/A 01/25/2024    LESI performed by Floretta Lamar PARAS, MD at [facility] AMBULATORY OR    SHOULDER ARTHROSCOPY Right      Social History     Socioeconomic History    Marital status: Widowed     Spouse name: Not on file    Number of children: Not on file    Years of education: Not on file    Highest education level: Not on file   Occupational History    Not on file   Tobacco Use    Smoking status: Former     Current packs/day: 0.00     Average packs/day: 1 pack/day for 20.0 years (  20.0 ttl pk-yrs)     Types: Cigarettes     Start date: 08/2002     Quit date: 08/2022     Years since quitting: 1.5    Smokeless tobacco: Never   Vaping Use    Vaping status: Never Used   Substance and Sexual Activity    Alcohol use: Not Currently     Comment: quit in 2003    Drug use: Never    Sexual activity: Not on file   Other Topics Concern    Not on file   Social History Narrative    Not on file     Social Drivers of Health     Financial Resource Strain: Not on file   Food Insecurity: No Food Insecurity (09/24/2022)    Hunger Vital Sign     Worried About Running Out of Food in the Last Year: Never true     Ran Out of Food in the Last Year: Never true   Transportation Needs: No Transportation Needs (09/24/2022)    PRAPARE - Therapist, art (Medical): No     Lack of Transportation (Non-Medical): No   Physical Activity: Not on file   Stress: Not on file   Social Connections: Not on file   Intimate Partner Violence: Not on file   Housing Stability: Low Risk   (09/24/2022)    Housing Stability Vital Sign     Unable to Pay for Housing in the Last Year: No     Number of Places Lived in the Last Year: 2     Unstable Housing in the Last Year: No      No family history on file.  No Known Allergies    Objective:   Ht 1.727 m (5' 8)   Wt 126.1 kg (278 lb)   BMI 42.27 kg/m            GENERAL APPEARANCE:  Well developed, well nourished, obese male, AOx3, in no acute distress, antalgic gait ambulating with a cane.         RESPIRATORY:  regular, No acute distress, breathing comfortably on room air.          SKIN:  normal, warm and dry, no rashes noted.          VASCULAR:  no edema, no cyanosis, no varicose veins present.          PSYCHIATRY:  alert, oriented, cognitive function intact.    RIGHT HIP EXAM:   Skin is intact without any evidence of trauma or infection and no obvious scars. No obvious evidence of deformity or malalignment. No obvious swelling about the right hip joint. No tenderness to palpation about the right hip joint without tenderness to palpation of the greater trochanter.  Severe pain with hip range of motion and Mild crepitus with range of motion. Hip ROM limited. Positive Logroll and Stinchfield Tests.  Positive FABER/FADIR Tests. Negative supine SLR. Ligamentous exam is stable without evidence of instability. Fires Quad/TA/GSC/EHL. N/V intact distally with sensation intact to light touch and palpable DP/PT pulses. RLE without varicosities present and No edema. Muscle strength 5/5 in all muscle groups.    LEFT HIP EXAM:  Skin is intact without any evidence of trauma or infection and no obvious scars. No obvious evidence of deformity or malalignment. No obvious swelling about the left hip joint. No tenderness to palpation about the left hip joint without tenderness to palpation of the greater trochanter.  No pain with hip  range of motion and No crepitus with range of motion. Hip ROM WNL. Negative Logroll and Stinchfield Tests.  Negative FABER/FADIR Tests.  Negative supine SLR. Ligamentous exam is stable without evidence of instability. Fires Quad/TA/GSC/EHL. N/V intact distally with sensation intact to light touch and palpable DP/PT pulses. LLE without varicosities present and No edema. Muscle strength 5/5 in all muscle groups.    The patient does not appreciate a leg length discrepancy but clinically it measures 2mm supine with the right lower extremity being short.      Imaging:    XR HIP RIGHT (2-3 VIEWS) 03/22/2024  X-ray reveals right hip joint spacing well-preserved but there is subchondral sclerosis, subchondral cystic formation, and cortical irregularity of the right femoral head noted consistent with avascular necrosis with subchondral collapse.     Of note, I also reviewed the right hip x-rays today that the patient brought in with him on a CD from his PCP. The x-rays revealed no fracture or dislocation and right hip joint spaces well preserved. There is mild subchondral sclerosis of the right femoral head but no evidence of subchondral collapse.       Assessment     1. Right hip pain    2. Avascular necrosis of bone of right hip (HCC)    3. Class 3 severe obesity due to excess calories with body mass index (BMI) of 40.0 to 44.9 in adult, unspecified whether serious comorbidity present (HCC)    4. Preoperative clearance         Plan     Morey Andonian  is a pleasant 58 y.o. male who presents today as a referral from his PCP for further evaluation of his Right hip pain that began about 6 months ago without inciting event. Unfortunately, his pain has gradually progressed since onset and his pain has become limiting, affecting his ADLs, and restricted his weightbearing on his RLE over the last 2-3 weeks. I reviewed the right hip x-rays with the patient today that the he brought in with him on a CD from his PCP. The x-rays revealed no fracture or dislocation and right hip joint spaces well preserved. There is mild subchondral sclerosis of the right femoral head  but no evidence of subchondral collapse. I also reviewed the hip x-rays with the patient today. X-rays revealed right hip joint spacing well-preserved but there is subchondral sclerosis, subchondral cystic formation, and cortical irregularity of the right femoral head noted consistent with avascular necrosis with subchondral collapse. The patient has progressive avascular necrosis of his right femoral head and unfortunately has developed subchondral collapse over the last 6 months. I discussed the nature of avascular necrosis of the hip with the patient.  We discussed the potential causes and the natural history as well as the uncertainty of how it may progress.  We discussed different treatment options to include medical management with medications such as Fosamax, gait aids, and surgery.  Surgery can include drilling or grafting and arthroplasty. The patient understands the disease can progress despite treatment and may ultimately require hip replacement surgery. The patient has failed conservative treatment and is interested in pursuing Right THA. We discussed the nature of the procedure, risks, outcomes, hospitalization, and recovery. The patient understands the risks and benefits of the surgery. Risks can include risk of anesthesia, risk of infection, risk of damage to neurovascular structures, risk of DVT, PE and Death, risk of fracture, risk of dislocation, risk of persistent pain, risk of stiffness, risk of hardware failure, and risk  of leg length inequality. We also discussed that he is at an increased risk of infection given his BMI. The patient understands these risks and wishes to proceed with the proposed surgery. AAOS appropriate use criteria consulted and recommend arthroplasty. Consequently, we posted the patient for a Right Total Hip Arthroplasty Lateral Approach today with [employer] as the vendor. We will send the patient for pre-operative medical evaluation and lab work with their PCP prior to  proceeding with surgery. We will do aspirin for DVT prophylaxis postoperatively and plan for next day discharge. All of the patient's questions were answered. We will see the patient back 2 weeks prior to surgery to discuss their results and surgery further.       Follow Up:     2 weeks prior to surgery       Electronically signed by Koren GEANNIE Littler, Jr. PA-C 03/27/2024 at 7:19 PM.

## 2024-04-12 ENCOUNTER — Encounter

## 2024-04-20 ENCOUNTER — Inpatient Hospital Stay: Admit: 2024-04-20 | Discharge: 2024-04-20 | Payer: Medicaid (Managed Care) | Primary: Physician Assistant

## 2024-04-20 LAB — COMPREHENSIVE METABOLIC PANEL
ALT: 33 U/L (ref 0–42)
AST: 60 U/L — ABNORMAL HIGH (ref 0–46)
Albumin/Globulin Ratio: 1.4 (ref 1.00–2.70)
Albumin: 4.2 g/dL (ref 3.5–5.2)
Alk Phosphatase: 89 U/L (ref 40–130)
Anion Gap: 10 mmol/L (ref 2–17)
BUN: 14 mg/dL (ref 6–20)
CO2: 29 mmol/L (ref 22–29)
Calcium: 9.2 mg/dL (ref 8.5–10.7)
Chloride: 100 mmol/L (ref 98–107)
Creatinine: 1.1 mg/dL (ref 0.7–1.3)
Est, Glom Filt Rate: 79 mL/min/1.73m (ref >=60)
Globulin: 3 g/dL (ref 1.9–4.4)
Glucose: 118 mg/dL — ABNORMAL HIGH (ref 70–99)
Osmolaliy Calculated: 279 mosm/kg (ref 270–287)
Potassium: 4.4 mmol/L (ref 3.5–5.3)
Sodium: 139 mmol/L (ref 135–145)
Total Bilirubin: 0.21 mg/dL (ref 0.00–1.20)
Total Protein: 7.2 g/dL (ref 5.7–8.3)

## 2024-04-20 LAB — CBC WITH AUTO DIFFERENTIAL
Basophils %: 0.3 % (ref 0.0–2.0)
Basophils Absolute: 0 x10e3/mcL (ref 0.0–0.2)
Eosinophils %: 1 % (ref 0.0–7.0)
Eosinophils Absolute: 0.1 x10e3/mcL (ref 0.0–0.5)
Hematocrit: 43.6 % (ref 38.0–52.0)
Hemoglobin: 13.8 g/dL (ref 13.0–17.3)
Immature Grans (Abs): 0.01 x10e3/mcL (ref 0.00–0.06)
Immature Granulocytes %: 0.1 % (ref 0.0–0.6)
Lymphocytes Absolute: 2.7 x10e3/mcL (ref 1.0–3.2)
Lymphocytes: 28.5 % (ref 15.0–45.0)
MCH: 30.7 pg (ref 27.0–34.5)
MCHC: 31.7 g/dL (ref 30.0–36.0)
MCV: 96.9 fL (ref 84.0–100.0)
MPV: 9.3 fL (ref 7.0–12.2)
Monocytes %: 7.1 % (ref 4.0–12.0)
Monocytes Absolute: 0.7 x10e3/mcL (ref 0.3–1.0)
Neutrophils %: 63 % (ref 42.0–74.0)
Neutrophils Absolute: 5.9 x10e3/mcL (ref 1.6–7.3)
Platelets: 229 x10e3/mcL (ref 140–440)
RBC: 4.5 x10e6/mcL (ref 4.00–5.60)
RDW: 13.2 % (ref 10.0–17.0)
WBC: 9.3 x10e3/mcL (ref 3.8–10.6)

## 2024-04-20 LAB — TSH: TSH, 3rd Generation: 2.43 u[IU]/mL (ref 0.358–3.740)

## 2024-04-21 ENCOUNTER — Ambulatory Visit
Admit: 2024-04-21 | Discharge: 2024-04-21 | Payer: Medicaid (Managed Care) | Attending: Physician Assistant | Primary: Physician Assistant

## 2024-04-21 DIAGNOSIS — M25551 Pain in right hip: Principal | ICD-10-CM

## 2024-04-21 LAB — LIPID PANEL
Chol/HDL Ratio: 2.9 (ref 0.0–4.4)
Cholesterol, Total: 116 mg/dL (ref 100–200)
HDL: 40 mg/dL (ref >=40)
LDL Cholesterol: 48 mg/dL (ref 0.0–100.0)
LDL/HDL Ratio: 1.2
Triglycerides: 142 mg/dL (ref 0–149)
VLDL: 28.4 mg/dL (ref 5.0–40.0)

## 2024-04-21 NOTE — Progress Notes (Addendum)
 Koren GEANNIE Littler, Mickey., PA-C  Phone [phone]  Fax [phone]    [address] [facility]  [address]  [place], [place]  [address]  [place], [place]  [address]      Subjective:   Reason for Visit:    Pre-op Exam (SX 8/7 RT THA)      History of Present Illness:   Richard Dennis  is a pleasant 58 y.o. male who presents today to follow-up his Right hip pain. Patient c/o continued right hip pain despite trying all conservative measures. His pain has gradually gotten worse since his last visit and is affecting his daily activities. Patient wishes to proceed with Right Total Hip Arthroplasty. Patient understands the nature of the procedure and the risks. The patient denies any recent falls, injuries, or infections.    Review of Systems   Constitutional:  Negative for activity change, chills and fever.   HENT:  Negative for sore throat.    Eyes:  Negative for visual disturbance.   Respiratory:  Negative for cough and shortness of breath.    Cardiovascular:  Negative for chest pain.   Gastrointestinal:  Negative for abdominal pain, constipation, diarrhea, nausea and vomiting.   Genitourinary:  Negative for dysuria, frequency and urgency.   Musculoskeletal:  Positive for arthralgias, gait problem, joint swelling and myalgias.   Skin:  Negative for rash and wound.   Neurological:  Negative for weakness and numbness.   Hematological:  Does not bruise/bleed easily.      Past Medical History:   Diagnosis Date    Anxiety and depression     Asthma     GERD (gastroesophageal reflux disease)     Hyperlipidemia     Obesity     Right hip pain      Past Surgical History:   Procedure Laterality Date    BICEPS TENDON REPAIR Right     CARPAL TUNNEL RELEASE Bilateral     CERVICAL SPINE SURGERY  2015    3 level ACDF    CHOLECYSTECTOMY, LAPAROSCOPIC      COLONOSCOPY N/A 06/28/2023    COLONOSCOPY BIOPSY performed by Ladean Carliss Rush, MD at [facility] ENDOSCOPY    LUMBAR SPINE SURGERY      x 2 fusions    PAIN MANAGEMENT  PROCEDURE N/A 01/25/2024    LESI performed by Floretta Lamar PARAS, MD at [facility]NER AMBULATORY OR    SHOULDER ARTHROSCOPY Right      Social History     Socioeconomic History    Marital status: Widowed     Spouse name: Not on file    Number of children: Not on file    Years of education: Not on file    Highest education level: Not on file   Occupational History    Not on file   Tobacco Use    Smoking status: Former     Current packs/day: 0.00     Average packs/day: 1 pack/day for 20.0 years (20.0 ttl pk-yrs)     Types: Cigarettes     Start date: 08/2002     Quit date: 08/2022     Years since quitting: 1.6    Smokeless tobacco: Never   Vaping Use    Vaping status: Never Used   Substance and Sexual Activity    Alcohol use: Not Currently     Comment: quit in 2003    Drug use: Never    Sexual activity: Not on file   Other Topics Concern  Not on file   Social History Narrative    Not on file     Social Drivers of Health     Financial Resource Strain: Not on file   Food Insecurity: No Food Insecurity (09/24/2022)    Hunger Vital Sign     Worried About Running Out of Food in the Last Year: Never true     Ran Out of Food in the Last Year: Never true   Transportation Needs: No Transportation Needs (09/24/2022)    PRAPARE - Therapist, art (Medical): No     Lack of Transportation (Non-Medical): No   Physical Activity: Not on file   Stress: Not on file   Social Connections: Not on file   Intimate Partner Violence: Not on file   Housing Stability: Low Risk  (09/24/2022)    Housing Stability Vital Sign     Unable to Pay for Housing in the Last Year: No     Number of Places Lived in the Last Year: 2     Unstable Housing in the Last Year: No      No family history on file.  No Known Allergies    Objective:   Ht 1.727 m (5' 8)   Wt 126.1 kg (278 lb)   BMI 42.27 kg/m      General Examination:         GENERAL APPEARANCE:  Well developed, well nourished, obese male, AOx3, in no acute distress,  antalgic gait ambulating with a cane.         EYES:  pupils equal, round, reactive to light, extraocular movement intact (EOMI).          NOSE:  nares patent.          ORAL CAVITY:  normal, good dentition, Oropharynx Pink and Moist.          NECK:  neck supple, no carotid bruit.          HEART:  normal, Regular rate and rhythm, no murmurs, rubs, gallops, or thrills.          RESPIRATORY:  regular, No acute distress, breathing comfortably on room air, no rhales/rhonchi/wheezing.          CHEST:  normal, clear to auscultation and percussion bilaterally.          ABDOMEN:  bowel sounds present, soft, nontender, nondistended, no tenderness.          SKIN:  normal, warm and dry, no rashes noted.          NEUROLOGIC:  alert and oriented, No numbness/tingling/weakness.         VASCULAR:  no edema, no cyanosis, , no varicose veins present.          PSYCHIATRY:  alert, oriented, cognitive function intact.    RIGHT HIP EXAM:   Skin is intact without any evidence of trauma or infection and no obvious scars. No obvious evidence of deformity or malalignment. No obvious swelling about the right hip joint. No tenderness to palpation about the right hip joint without tenderness to palpation of the greater trochanter.  Severe pain with hip range of motion and Mild crepitus with range of motion. Hip ROM limited. Positive Logroll and Stinchfield Tests.  Positive FABER/FADIR Tests. Negative supine SLR. Ligamentous exam is stable without evidence of instability. Fires Quad/TA/GSC/EHL. N/V intact distally with sensation intact to light touch and palpable DP/PT pulses. RLE without varicosities present and No edema. Muscle strength 5/5 in all muscle  groups.    LEFT HIP EXAM:  Skin is intact without any evidence of trauma or infection and no obvious scars. No obvious evidence of deformity or malalignment. No obvious swelling about the left hip joint. No tenderness to palpation about the left hip joint without tenderness to palpation of the  greater trochanter.  No pain with hip range of motion and No crepitus with range of motion. Hip ROM WNL. Negative Logroll and Stinchfield Tests.  Negative FABER/FADIR Tests. Negative supine SLR. Ligamentous exam is stable without evidence of instability. Fires Quad/TA/GSC/EHL. N/V intact distally with sensation intact to light touch and palpable DP/PT pulses. LLE without varicosities present and No edema. Muscle strength 5/5 in all muscle groups.    The patient does not appreciate a leg length discrepancy but clinically it measures 2mm supine with the right lower extremity being short.        Assessment     1. Right hip pain    2. Avascular necrosis of bone of right hip (HCC)    3. Class 3 severe obesity due to excess calories with body mass index (BMI) of 40.0 to 44.9 in adult, unspecified whether serious comorbidity present (HCC)           Plan     I discussed the nature of avascular necrosis of the hip with the patient.  We discussed the potential causes and the natural history as well as the uncertainty of how it may progress.  We discussed different treatment options to include medical management with medications such as Fosamax, gait aids, and surgery.  Surgery can include drilling or grafting and arthroplasty. The patient understands the disease can progress despite treatment and may ultimately require hip replacement surgery. The patient has failed conservative treatment and is interested in pursuing THA. We discussed the nature of the procedure, risks, outcomes, hospitalization, and recovery. The patient was given literature regarding THA and wishes to proceed with Right THA. The patient understands the risks and benefits of the surgery. Risks can include risk of anesthesia, risk of infection, risk of damage to neurovascular structures, risk of DVT, PE and Death, risk of fracture, risk of dislocation, risk of persistent pain, risk of stiffness, risk of hardware failure, and risk of leg length inequality. We  also discussed that he is at an increased risk of infection given his BMI. The patient understands these risks and wishes to proceed with the proposed surgery. I reviewed the patient's laboratory work and preoperative clearances. Patient's lab work was all felt to be acceptable and showed no uncontrolled medical condition that may compromise the proposed surgical intervention. The patient has been evaluated and cleared for surgery by their PCP. Consequently, we will plan to proceed with Right THA Lateral Approach in the near future. We will do Aspirin for DVT prophylaxis post-operatively and plan for next day discharge. All the patient's questions were answered. We will plan to see the patient back 4 weeks after surgery.    The patient already has a cane at home, but I am dispensing a walker to my patient today. The patient will have mobility limitations during ADLs after their joint replacement surgery. This can be resolved with use of a cane and/or walker and the patient is safe to use one.    The patient's history, physical exam findings, diagnosis, and recommended treatments were discussed with Dr. Marykay, who also met with and examined the patient today, who was in agreement with the plan outlined above.    I  have personally spent a total of 32 minutes reviewing the patient's pre-operative lab work, pre-operative PCP clearances, prior/current imaging, conducting a full ROS, and thorough pre-operative medical exam, as well as discussing the proposed surgical procedure, pre-, intra-, and post-operative expectations in detail with the patient, and determining the patient's post-operative DME needs today.          Procedures    Walker standard            Follow-up     4 weeks after surgery    Electronically signed by Koren GEANNIE Littler, Jr. PA-C 04/22/2024 at 12:01 PM.

## 2024-04-21 NOTE — H&P (View-Only) (Signed)
 Koren GEANNIE Littler, Mickey., PA-C  Phone [phone]  Fax [phone]    [address] [facility]  [address]  [place], [place]  [address]  [place], [place]  [address]      Subjective:   Reason for Visit:    Pre-op Exam (SX 8/7 RT THA)      History of Present Illness:   Richard Dennis  is a pleasant 58 y.o. male who presents today to follow-up his Right hip pain. Patient c/o continued right hip pain despite trying all conservative measures. His pain has gradually gotten worse since his last visit and is affecting his daily activities. Patient wishes to proceed with Right Total Hip Arthroplasty. Patient understands the nature of the procedure and the risks. The patient denies any recent falls, injuries, or infections.    Review of Systems   Constitutional:  Negative for activity change, chills and fever.   HENT:  Negative for sore throat.    Eyes:  Negative for visual disturbance.   Respiratory:  Negative for cough and shortness of breath.    Cardiovascular:  Negative for chest pain.   Gastrointestinal:  Negative for abdominal pain, constipation, diarrhea, nausea and vomiting.   Genitourinary:  Negative for dysuria, frequency and urgency.   Musculoskeletal:  Positive for arthralgias, gait problem, joint swelling and myalgias.   Skin:  Negative for rash and wound.   Neurological:  Negative for weakness and numbness.   Hematological:  Does not bruise/bleed easily.      Past Medical History:   Diagnosis Date    Anxiety and depression     Asthma     GERD (gastroesophageal reflux disease)     Hyperlipidemia     Obesity     Right hip pain      Past Surgical History:   Procedure Laterality Date    BICEPS TENDON REPAIR Right     CARPAL TUNNEL RELEASE Bilateral     CERVICAL SPINE SURGERY  2015    3 level ACDF    CHOLECYSTECTOMY, LAPAROSCOPIC      COLONOSCOPY N/A 06/28/2023    COLONOSCOPY BIOPSY performed by Ladean Carliss Rush, MD at [facility] ENDOSCOPY    LUMBAR SPINE SURGERY      x 2 fusions    PAIN MANAGEMENT  PROCEDURE N/A 01/25/2024    LESI performed by Floretta Lamar PARAS, MD at [facility] AMBULATORY OR    SHOULDER ARTHROSCOPY Right      Social History     Socioeconomic History    Marital status: Widowed     Spouse name: Not on file    Number of children: Not on file    Years of education: Not on file    Highest education level: Not on file   Occupational History    Not on file   Tobacco Use    Smoking status: Former     Current packs/day: 0.00     Average packs/day: 1 pack/day for 20.0 years (20.0 ttl pk-yrs)     Types: Cigarettes     Start date: 08/2002     Quit date: 08/2022     Years since quitting: 1.6    Smokeless tobacco: Never   Vaping Use    Vaping status: Never Used   Substance and Sexual Activity    Alcohol use: Not Currently     Comment: quit in 2003    Drug use: Never    Sexual activity: Not on file   Other Topics Concern  Not on file   Social History Narrative    Not on file     Social Drivers of Health     Financial Resource Strain: Not on file   Food Insecurity: No Food Insecurity (09/24/2022)    Hunger Vital Sign     Worried About Running Out of Food in the Last Year: Never true     Ran Out of Food in the Last Year: Never true   Transportation Needs: No Transportation Needs (09/24/2022)    PRAPARE - Therapist, art (Medical): No     Lack of Transportation (Non-Medical): No   Physical Activity: Not on file   Stress: Not on file   Social Connections: Not on file   Intimate Partner Violence: Not on file   Housing Stability: Low Risk  (09/24/2022)    Housing Stability Vital Sign     Unable to Pay for Housing in the Last Year: No     Number of Places Lived in the Last Year: 2     Unstable Housing in the Last Year: No      No family history on file.  No Known Allergies    Objective:   Ht 1.727 m (5' 8)   Wt 126.1 kg (278 lb)   BMI 42.27 kg/m      General Examination:         GENERAL APPEARANCE:  Well developed, well nourished, obese male, AOx3, in no acute distress,  antalgic gait ambulating with a cane.         EYES:  pupils equal, round, reactive to light, extraocular movement intact (EOMI).          NOSE:  nares patent.          ORAL CAVITY:  normal, good dentition, Oropharynx Pink and Moist.          NECK:  neck supple, no carotid bruit.          HEART:  normal, Regular rate and rhythm, no murmurs, rubs, gallops, or thrills.          RESPIRATORY:  regular, No acute distress, breathing comfortably on room air, no rhales/rhonchi/wheezing.          CHEST:  normal, clear to auscultation and percussion bilaterally.          ABDOMEN:  bowel sounds present, soft, nontender, nondistended, no tenderness.          SKIN:  normal, warm and dry, no rashes noted.          NEUROLOGIC:  alert and oriented, No numbness/tingling/weakness.         VASCULAR:  no edema, no cyanosis, , no varicose veins present.          PSYCHIATRY:  alert, oriented, cognitive function intact.    RIGHT HIP EXAM:   Skin is intact without any evidence of trauma or infection and no obvious scars. No obvious evidence of deformity or malalignment. No obvious swelling about the right hip joint. No tenderness to palpation about the right hip joint without tenderness to palpation of the greater trochanter.  Severe pain with hip range of motion and Mild crepitus with range of motion. Hip ROM limited. Positive Logroll and Stinchfield Tests.  Positive FABER/FADIR Tests. Negative supine SLR. Ligamentous exam is stable without evidence of instability. Fires Quad/TA/GSC/EHL. N/V intact distally with sensation intact to light touch and palpable DP/PT pulses. RLE without varicosities present and No edema. Muscle strength 5/5 in all muscle  groups.    LEFT HIP EXAM:  Skin is intact without any evidence of trauma or infection and no obvious scars. No obvious evidence of deformity or malalignment. No obvious swelling about the left hip joint. No tenderness to palpation about the left hip joint without tenderness to palpation of the  greater trochanter.  No pain with hip range of motion and No crepitus with range of motion. Hip ROM WNL. Negative Logroll and Stinchfield Tests.  Negative FABER/FADIR Tests. Negative supine SLR. Ligamentous exam is stable without evidence of instability. Fires Quad/TA/GSC/EHL. N/V intact distally with sensation intact to light touch and palpable DP/PT pulses. LLE without varicosities present and No edema. Muscle strength 5/5 in all muscle groups.    The patient does not appreciate a leg length discrepancy but clinically it measures 2mm supine with the right lower extremity being short.        Assessment     1. Right hip pain    2. Avascular necrosis of bone of right hip (HCC)    3. Class 3 severe obesity due to excess calories with body mass index (BMI) of 40.0 to 44.9 in adult, unspecified whether serious comorbidity present (HCC)           Plan     I discussed the nature of avascular necrosis of the hip with the patient.  We discussed the potential causes and the natural history as well as the uncertainty of how it may progress.  We discussed different treatment options to include medical management with medications such as Fosamax, gait aids, and surgery.  Surgery can include drilling or grafting and arthroplasty. The patient understands the disease can progress despite treatment and may ultimately require hip replacement surgery. The patient has failed conservative treatment and is interested in pursuing THA. We discussed the nature of the procedure, risks, outcomes, hospitalization, and recovery. The patient was given literature regarding THA and wishes to proceed with Right THA. The patient understands the risks and benefits of the surgery. Risks can include risk of anesthesia, risk of infection, risk of damage to neurovascular structures, risk of DVT, PE and Death, risk of fracture, risk of dislocation, risk of persistent pain, risk of stiffness, risk of hardware failure, and risk of leg length inequality. We  also discussed that he is at an increased risk of infection given his BMI. The patient understands these risks and wishes to proceed with the proposed surgery. I reviewed the patient's laboratory work and preoperative clearances. Patient's lab work was all felt to be acceptable and showed no uncontrolled medical condition that may compromise the proposed surgical intervention. The patient has been evaluated and cleared for surgery by their PCP. Consequently, we will plan to proceed with Right THA Lateral Approach in the near future. We will do Aspirin for DVT prophylaxis post-operatively and plan for next day discharge. All the patient's questions were answered. We will plan to see the patient back 4 weeks after surgery.    The patient already has a cane at home, but I am dispensing a walker to my patient today. The patient will have mobility limitations during ADLs after their joint replacement surgery. This can be resolved with use of a cane and/or walker and the patient is safe to use one.    The patient's history, physical exam findings, diagnosis, and recommended treatments were discussed with Dr. Marykay, who also met with and examined the patient today, who was in agreement with the plan outlined above.    I  have personally spent a total of 32 minutes reviewing the patient's pre-operative lab work, pre-operative PCP clearances, prior/current imaging, conducting a full ROS, and thorough pre-operative medical exam, as well as discussing the proposed surgical procedure, pre-, intra-, and post-operative expectations in detail with the patient, and determining the patient's post-operative DME needs today.          Procedures    Walker standard            Follow-up     4 weeks after surgery    Electronically signed by Koren GEANNIE Littler, Jr. PA-C 04/22/2024 at 12:01 PM.

## 2024-04-21 NOTE — Progress Notes (Signed)
 Pre Procedure Patient Instructions     Procedure Location hospital:[facility]: [address], [place] - Park in front of Hospital Main Entrance and check in at [employer].    Procedure Date 08/07  Arrival Time Per Physicians Instructions    Your doctor determines your scheduled start time.  Depending on the facility where your procedure is taking place and the scheduled start time, you may be required to arrive up to 2 hours prior.  This is to allow time for registration, your preop assessment, any day of procedure testing and to meet with your care teams members.  This also allows your procedure to be performed earlier should there be a cancellation that day.  We appreciate your patience as we work to provide you with excellent service.    Medications: Follow the medication instructions below to prevent your procedure from being cancelled.    Medication to be taken the morning of surgery with a few sips of water only: ATORVASTATIN , BUSPIRONE , FLUOXETINE , LAMOTRIGINE , OMEPRAZOLE , INHALER IF NEEDED    Continue to take your medications as prescribed, with the following exceptions:     Hold or reduce the dosage of the following medication, as discussed:     Hold Ibuprofen/Advil/Motrin 7 days prior to procedure.  LAST DOSE 07/30    Stop all supplements, vitamins and herbal remedies one week prior to your procedure.  LAST DOSE 07/30    Do not take over the counter pain medications except plain Tylenol  or Acetaminophen  for seven days prior to your procedure unless your doctor told you otherwise.    If you are taking any diabetes and or weight loss medications (including injectables and shots) not discussed during the call with the PreAdmission Nurse, it is important to call [phone] to discuss important instructions.    If you are taking blood thinners, and have not been told when to hold the dose, call the doctor performing your procedure for instructions on when to stop.    On the day of your procedure,  a nurse will review your medications and ask when you last took each one.        Procedure Preparation    Diet Restrictions:No food or drink including gum or mints -after midnight the day of your procedure.      If you have questions on the day of your procedure call [facility] Preop at [phone].    Skin Preparation:  Follow your doctor's instructions regarding Hibiclens or antimicrobial wipes., Using a clean washcloth, wash from neck to toes for 3 minutes. Gently clean the area where your surgery will be done thoroughly., and Do not use Hibiclens on or near your face, eyes, ears or head.    Do not put on any deodorants, lotions, powders, or oils on the day of procedure.  Be sure to put on clean, comfortable loose fitting clothing.    Other Preparation:  Call your doctor right away if you get any wounds, cuts, scrapes, scabs, rashes, bug bites at or near your procedure site or if you have any fever, cold or flu symptoms    No test required before your procedure.      If you have any scheduling concerns or procedure questions (including questions after your procedure) please contact your physician's office. Marykay Hamilton, MD  Phone Number: [phone]     Day of Procedure Patient Instruction:  Do not smoke, vape, chew tobacco, drink alcohol or use recreational drugs on the day of your procedure  Remove all jewelry, piercings, and metal accessories  Do not wear artificial nails and only clear nail polish on natural nails. Nails must be trimmed to fingertip length to make sure the oxygen probe fits properly and to avoid injury  Do not wear artificial eye lashes because they can cause dry eyes, eye scratches, eye infections and allergic reactions  Do not use hair extensions with metal clips or hairstyles near the back of your neck, as it can make it difficult to safely manage your breathing during anesthesia  If your hair is tightly braided or styled in a complex way, it may need to be  undone for your safety  Do not wear contacts, tampons, make-up, lotions, creams, powders, fragrances or deodorant  Wear loose fit clothing  Do not bring valuables or money  Bring a copy of your Living Will and/or Medical Durable Power of Attorney if you have one  Bring a list of current medications including name and dosage  Bring a picture ID and insurance card     Bring your inhaler(s)  Bring a walker or other orthopedic device necessary for postop  Bring a storage case for eyeglasses, hearing aids, dentures, etc  Refer to the doctor's office for day of procedure recommended clothing          If you are going home the same day as your procedure, a support person should accompany you to the facility and must transport you home.  If you plan to take public transportation of any sort, your support person must accompany you home.  You should have someone to stay with you for 24 hours after your procedure with sedation of any kind.       Comments:     This information was reviewed with you during your Pre-Admission Testing interview and you verbalized understanding. If you have any additional questions please contact [phone].    To pre-register for your procedure OR for billing estimate questions please call [phone] Option 2 and then Option 3.    For financial questions AFTER your procedure is complete please contact [phone].    For financial questions regarding anesthesia at a [facility] facility, please  contact [phone].    For MyChart Patient Portal help please call [phone].    For hotel accommodations please visit [email or web address] and select PATIENTS &VISITORS -> "FOR VISITORS" section and then TRAVEL INFORMATION -> PLACES TO STAY

## 2024-04-30 NOTE — Discharge Instructions (Signed)
 Weightbearing as tolerated on the operative side.  No forced/active hip abduction for 6 weeks.

## 2024-04-30 NOTE — Discharge Instructions (Signed)
 Richard Dennis, M.D.   Adult Hip and Knee Reconstruction   [phone]     [address] [facility]           [address]  [place], [place]  [address]          [place], [place]  [address]         Total Hip Replacement Discharge Instructions       Dressing:    -Your wound will be covered by a dressing after surgery.  It should usually be removed after 7 days. You can shower as long as there is no drainage from the wound.  After the dressing is removed it is not recommended to apply any cream, ointment, or lotion to the wound unless specific instructions are given by your surgeon.      -Most of the time, your stitches will be under the skin and will dissolve of their own.  If you have staples or external stitches, they can be removed 2 weeks after surgery as long as there is no drainage.  If the wound is draining, the dressing should be changed daily.  The wound should be dry and without drainage by about 7 days postoperative.  If there is persistent drainage from the wound after this time period, you should call our office immediately.  If there is worsening redness around the incision, you should also call the office immediately.  These may be signs of a superficial or deep wound infection and you may have to return to the office for an evaluation by one of our staff.     -Common concerns after hip replacement surgery include swelling and bruising.  These can be quite significant in nature and can appear anywhere from the thigh to the toes. These are typically worse at night which can contribute to trouble sleeping comfortably for more than one to two hours at a time.        Activity:    You will be using an assistive device (walker, crutches, or cane).  Your physical therapist will help you with using these devices.  Most patients are able to get in and out of bed, use the restroom, and go up and down stairs when they go home.  We'd like you to get up and walk every hour after surgery.   For the first 2 weeks you will be walking with a walker or two crutches.  After that, you can start using a cane.         Physical Therapy:    Physical Therapy should be arranged for you prior to your discharge.  Therapy should begin 1 or 2 days after surgery and continue 3 times a week for a month.  Do the exercises at home on the days you do not see a therapist.        Preventing Postoperative Hip Dislocations:    Dislocations are rare, but if they occur, they most often occur the first 3 months after surgery. Before surgery, the physical therapist begins teaching you special precautions and how to avoid dislocation.  After surgery, everyone will be reminding you not to bend the hip too much, not to twist at the waist, and to avoid turning your leg in or out.  Patients hip joints are so stable after surgery that they do not have dislocation precautions.  If you are one of these patients, the therapist will tell you not to worry about dislocation but you should still avoid extreme  bending and twisting.  Again, your therapist will go over this after surgery.        Bathing:    Your dressing is waterproof. You may take a shower but not a bath.        Postoperative Medications:    You will be given pain medications after surgery to help with the pain.  In addition to pain medications, you will be given Aspirin to decrease the incidence of deep vein thrombosis/pulmonary embolism.         Other Information:    -It is very common to have swelling and bruising in the thigh, lower leg and foot after surgery. Elevating your leg, doing ankle pump exercises, and using ice packs will help.       -Call us if the swelling does not subside overnight.      -Call for a temperature over 101 F.      -Take the pain medications as needed for pain.      -Pain pills can cause constipation.  Use over-the-counter stool softeners like Colace to avoid constipation.  If Colace is not effective use a gentle over-the-counter laxative such as  Miralax.     -Please refer to your medication sheet for more information regarding any prescriptions you have been given to take after surgery.        Follow-up office visit:    -Dr. Jacquelyne Balint would like to see you in 4-weeks.  If the follow-up appointment is not already scheduled, then please call Dr. McDermott's office to setup this appointment at [phone].     -Please avoid any other surgery, procedure, or dental procedure until cleared by Dr. Jacquelyne Balint.         Common Questions About Hip Replacement:     Question: How long can I expect to have pain after surgery?     Answer: The time varies for each patient. Many patients report that there is very little pain right after surgery, but postoperative soreness may continue for 3 - 4 weeks.        Question. How long until bone ingrowth occurs?      Answer. Bone ingrowth occurs between 6 weeks and 1 year.         Question. How long after surgery will I have to limit weight bearing on my leg?      Answer. The amount of weight you are allowed to put on your leg varies from full weight bearing to just the weight of your foot. Several surgical factors are considered in making this decision and your surgeon will inform you of your weight bearing status following the procedure. Most patients are cleared to be full weightbearing as tolerated.        Question. Why do I have to take a blood thinner after surgery and how long will this continue?     Answer. A blood thinning medicine is recommended to prevent blood clots and is usually discontinued after your first follow-up appointment.        Question: When can I resume sexual activities?     Answer. You can resume sexual activity 3 - 6 weeks after surgery. The physical therapist will help guide safe techniques.        Question: Why does the skin feel funny around my incision?     Answer: The nerves in the skin cross the front of the hip in an inside-out direction. When an incision is made on the hip, these tiny nerves  are  divided and the skin on the outside will feel fuzzy or numb. This sensation will lessen with time and is normal for all patients with hip replacement surgery.        Question: Why is my leg discolored?     Answer: You may develop some discoloration (like a bruise) in the leg. This discoloration, which may extend to the hip or ankle, will slowly disappear.        Question: What about cocoa butter and vitamin E oil?     Answer:  Do not use either of these until after your 4-week postoperative visit. Ask for clearance to use during that visit.         Question: A stitch is sticking out. What do I do?     Answer: We often suture the skin from underneath to reduce scarring. The knot at the end of the stitch sometimes will protrude from the skin. Redness and a small amount of drainage may appear. Cleanse the skin with peroxide. Please notify your surgeons of office.        Question: When can I drive my car?     Answer: Usually after 4 weeks. A patient's decision to drive sooner is a personal decision related to their mobility and pain control. You cannot drive while you are taking narcotic pain medicine such as Norco, Percocet, Hydrocodone or Oxycodone.        Question: When can I go in the swimming pool?     Answer: Ordinarily, patients may resume pool activities after the first follow-up visit. Be sure to check with the surgeon at that time.        Question. When can I start playing tennis, ski or golf?     Answer. Active sports are generally not resumed until 3 - 6 months after surgery.  This is to allow for bone ingrowth of your new hip and muscle strengthening.        Question. When will I be able to return to work?     Answer. This depends on the type of work you do as well as several other factors. This is on an individual basis and you should discuss with your surgeon.

## 2024-04-30 NOTE — Discharge Instructions - Pharmacy (Signed)
 All post-op prescriptions have been E-prescribed to your pharmacy.

## 2024-05-04 ENCOUNTER — Observation Stay
Admit: 2024-05-04 | Discharge: 2024-05-05 | Disposition: A | Discharge status: Home Health | Payer: Medicaid (Managed Care) | Attending: Orthopaedic Surgery | Admitting: Orthopaedic Surgery

## 2024-05-04 ENCOUNTER — Observation Stay: Admit: 2024-05-04 | Payer: Medicaid (Managed Care) | Primary: Physician Assistant

## 2024-05-04 DIAGNOSIS — M87051 Idiopathic aseptic necrosis of right femur: Secondary | ICD-10-CM

## 2024-05-04 MED ORDER — STERILE WATER FOR INJECTION (MIXTURES ONLY)
3 | Freq: Three times a day (TID) | Status: AC
Start: 2024-05-04 — End: 2024-05-05
  Administered 2024-05-05 (×2): 3000 mg via INTRAVENOUS

## 2024-05-04 MED ORDER — ROPIVACAINE HCL 5 MG/ML IJ SOLN
INTRAMUSCULAR | Status: AC
Start: 2024-05-04 — End: ?

## 2024-05-04 MED ORDER — HYDROMORPHONE HCL PF 2 MG/ML IJ SOLN
2 | INTRAMUSCULAR | Status: DC | PRN
Start: 2024-05-04 — End: 2024-05-05

## 2024-05-04 MED ORDER — BUPIVACAINE IN DEXTROSE 0.75-8.25 % IT SOLN
0.75-8.25 | Freq: Once | INTRATHECAL | Status: AC | PRN
Start: 2024-05-04 — End: 2024-05-04
  Administered 2024-05-04: 17:00:00 12 via INTRATHECAL

## 2024-05-04 MED ORDER — ALBUTEROL SULFATE HFA 108 (90 BASE) MCG/ACT IN AERS
108 | RESPIRATORY_TRACT | Status: DC | PRN
Start: 2024-05-04 — End: 2024-05-05

## 2024-05-04 MED ORDER — ONDANSETRON HCL 4 MG/2ML IJ SOLN
4 | INTRAMUSCULAR | Status: AC
Start: 2024-05-04 — End: ?

## 2024-05-04 MED ORDER — SODIUM CHLORIDE 0.9 % IV SOLN
0.9 | INTRAVENOUS | Status: DC | PRN
Start: 2024-05-04 — End: 2024-05-04
  Administered 2024-05-04: 18:00:00 50 via INTRAVENOUS

## 2024-05-04 MED ORDER — PHENYLEPHRINE HCL 1 MG/10ML IV SOSY
1 | INTRAVENOUS | Status: AC
Start: 2024-05-04 — End: ?

## 2024-05-04 MED ORDER — BACLOFEN 10 MG PO TABS
10 | Freq: Three times a day (TID) | ORAL | Status: DC
Start: 2024-05-04 — End: 2024-05-05
  Administered 2024-05-04 – 2024-05-05 (×2): 10 mg via ORAL

## 2024-05-04 MED ORDER — SODIUM CHLORIDE (PF) 0.9 % IJ SOLN
0.9 | INTRAMUSCULAR | Status: DC | PRN
Start: 2024-05-04 — End: 2024-05-04

## 2024-05-04 MED ORDER — SODIUM CHLORIDE (PF) 0.9 % IJ SOLN
0.9 | INTRAMUSCULAR | Status: AC
Start: 2024-05-04 — End: ?

## 2024-05-04 MED ORDER — NORMAL SALINE FLUSH 0.9 % IV SOLN
0.9 | INTRAVENOUS | Status: DC | PRN
Start: 2024-05-04 — End: 2024-05-05
  Administered 2024-05-05 (×4): 10 mL via INTRAVENOUS

## 2024-05-04 MED ORDER — SODIUM CHLORIDE 0.9 % IV SOLN
0.9 | INTRAVENOUS | Status: DC
Start: 2024-05-04 — End: 2024-05-05
  Administered 2024-05-04 (×2): via INTRAVENOUS

## 2024-05-04 MED ORDER — LACTATED RINGERS IV SOLN
INTRAVENOUS | Status: DC | PRN
Start: 2024-05-04 — End: 2024-05-04
  Administered 2024-05-04: 17:00:00 via INTRAVENOUS

## 2024-05-04 MED ORDER — ACETAMINOPHEN 325 MG PO TABS
325 | Freq: Four times a day (QID) | ORAL | Status: DC
Start: 2024-05-04 — End: 2024-05-05
  Administered 2024-05-05 (×3): 650 mg via ORAL

## 2024-05-04 MED ORDER — NORMAL SALINE FLUSH 0.9 % IV SOLN
0.9 | Freq: Two times a day (BID) | INTRAVENOUS | Status: DC
Start: 2024-05-04 — End: 2024-05-04

## 2024-05-04 MED ORDER — PROPOFOL 1000 MG/100ML IV EMUL
1000 | INTRAVENOUS | Status: DC | PRN
Start: 2024-05-04 — End: 2024-05-04
  Administered 2024-05-04: 17:00:00 100 via INTRAVENOUS

## 2024-05-04 MED ORDER — TRANEXAMIC ACID 1000 MG/10ML IV SOLN
1000 | INTRAVENOUS | Status: AC
Start: 2024-05-04 — End: 2024-05-04
  Administered 2024-05-04: 17:00:00 2400 mg via INTRAVENOUS

## 2024-05-04 MED ORDER — PANTOPRAZOLE SODIUM 40 MG PO TBEC
40 | Freq: Every day | ORAL | Status: DC
Start: 2024-05-04 — End: 2024-05-05
  Administered 2024-05-05: 10:00:00 40 mg via ORAL

## 2024-05-04 MED ORDER — DEXAMETHASONE SODIUM PHOSPHATE 4 MG/ML IJ SOLN
4 | INTRAMUSCULAR | Status: AC
Start: 2024-05-04 — End: ?

## 2024-05-04 MED ORDER — NALOXONE HCL 4 MG/0.1ML NA LIQD
4 | NASAL | 0 refills | 1.00000 days | Status: AC | PRN
Start: 2024-05-04 — End: ?

## 2024-05-04 MED ORDER — ASPIRIN 81 MG PO TBEC
81 | ORAL_TABLET | Freq: Two times a day (BID) | ORAL | 0 refills | 30.00000 days | Status: AC
Start: 2024-05-04 — End: 2024-06-03

## 2024-05-04 MED ORDER — CELECOXIB 100 MG PO CAPS
100 | Freq: Once | ORAL | Status: AC
Start: 2024-05-04 — End: 2024-05-04
  Administered 2024-05-04: 14:00:00 200 mg via ORAL

## 2024-05-04 MED ORDER — FLUOXETINE HCL 10 MG PO CAPS
10 | Freq: Every day | ORAL | Status: DC
Start: 2024-05-04 — End: 2024-05-05
  Administered 2024-05-04 – 2024-05-05 (×2): 10 mg via ORAL

## 2024-05-04 MED ORDER — KETAMINE HCL 50 MG/5ML IJ SOSY
50 | INTRAMUSCULAR | Status: AC
Start: 2024-05-04 — End: ?

## 2024-05-04 MED ORDER — FENTANYL CITRATE (PF) 100 MCG/2ML IJ SOLN
100 | INTRAMUSCULAR | Status: DC | PRN
Start: 2024-05-04 — End: 2024-05-04
  Administered 2024-05-04 (×2): 25 ug via INTRAVENOUS

## 2024-05-04 MED ORDER — SODIUM CHLORIDE 0.9 % IV SOLN (MINI-BAG)
0.9 | INTRAVENOUS | Status: AC
Start: 2024-05-04 — End: 2024-05-04
  Administered 2024-05-04: 17:00:00 3000 mg via INTRAVENOUS

## 2024-05-04 MED ORDER — GLYCOPYRROLATE 1 MG/5ML IV SOSY
1 | INTRAVENOUS | Status: AC
Start: 2024-05-04 — End: ?

## 2024-05-04 MED ORDER — CELECOXIB 200 MG PO CAPS
200 | ORAL_CAPSULE | Freq: Two times a day (BID) | ORAL | 0 refills | 60.00000 days | Status: AC
Start: 2024-05-04 — End: 2024-06-03

## 2024-05-04 MED ORDER — ACETAMINOPHEN 500 MG PO TABS
500 | ORAL_TABLET | Freq: Four times a day (QID) | ORAL | 0 refills | 10.00000 days | Status: AC | PRN
Start: 2024-05-04 — End: ?

## 2024-05-04 MED ORDER — BUPIVACAINE IN DEXTROSE 0.75-8.25 % IT SOLN
0.75-8.25 | INTRATHECAL | Status: AC
Start: 2024-05-04 — End: ?

## 2024-05-04 MED ORDER — MIDAZOLAM HCL 2 MG/2ML IJ SOLN
2 | INTRAMUSCULAR | Status: AC
Start: 2024-05-04 — End: ?

## 2024-05-04 MED ORDER — OXYCODONE HCL 5 MG PO TABS
5 | ORAL | Status: DC | PRN
Start: 2024-05-04 — End: 2024-05-05
  Administered 2024-05-05 (×2): 10 mg via ORAL

## 2024-05-04 MED ORDER — HYDROMORPHONE HCL 1 MG/ML IJ SOLN
1 | INTRAMUSCULAR | Status: DC | PRN
Start: 2024-05-04 — End: 2024-05-04

## 2024-05-04 MED ORDER — LAMOTRIGINE 100 MG PO TABS
100 | Freq: Every day | ORAL | Status: DC
Start: 2024-05-04 — End: 2024-05-05
  Administered 2024-05-04 – 2024-05-05 (×2): 150 mg via ORAL

## 2024-05-04 MED ORDER — BUSPIRONE HCL 10 MG PO TABS
10 | Freq: Two times a day (BID) | ORAL | Status: DC
Start: 2024-05-04 — End: 2024-05-05
  Administered 2024-05-04 – 2024-05-05 (×2): 10 mg via ORAL

## 2024-05-04 MED ORDER — MAGNESIUM 400 MG PO CAPS
400 | Freq: Every morning | ORAL | Status: DC
Start: 2024-05-04 — End: 2024-05-05
  Administered 2024-05-05: 14:00:00 400 via ORAL

## 2024-05-04 MED ORDER — ONDANSETRON 4 MG PO TBDP
4 | Freq: Three times a day (TID) | ORAL | Status: DC | PRN
Start: 2024-05-04 — End: 2024-05-05

## 2024-05-04 MED ORDER — KETAMINE HCL 50 MG/5ML IJ SOSY
50 | Freq: Once | INTRAMUSCULAR | Status: DC | PRN
Start: 2024-05-04 — End: 2024-05-04
  Administered 2024-05-04: 17:00:00 20 via INTRAVENOUS
  Administered 2024-05-04: 17:00:00 10 via INTRAVENOUS

## 2024-05-04 MED ORDER — SODIUM CHLORIDE 0.9 % IV SOLN
0.9 | INTRAVENOUS | Status: DC
Start: 2024-05-04 — End: 2024-05-04

## 2024-05-04 MED ORDER — MIDAZOLAM HCL 2 MG/2ML IJ SOLN
2 | Freq: Once | INTRAMUSCULAR | Status: DC | PRN
Start: 2024-05-04 — End: 2024-05-04
  Administered 2024-05-04: 17:00:00 2 via INTRAVENOUS

## 2024-05-04 MED ORDER — ALUM & MAG HYDROXIDE-SIMETH 200-200-20 MG/5ML PO SUSP
200-200-20 | Freq: Four times a day (QID) | ORAL | Status: DC | PRN
Start: 2024-05-04 — End: 2024-05-05

## 2024-05-04 MED ORDER — SODIUM CHLORIDE 0.9 % IV SOLN
0.9 | INTRAVENOUS | Status: DC | PRN
Start: 2024-05-04 — End: 2024-05-04

## 2024-05-04 MED ORDER — ACETAMINOPHEN 500 MG PO TABS
500 | Freq: Once | ORAL | Status: AC
Start: 2024-05-04 — End: 2024-05-04
  Administered 2024-05-04: 14:00:00 1000 mg via ORAL

## 2024-05-04 MED ORDER — OLANZAPINE 5 MG PO TABS
5 | Freq: Every evening | ORAL | Status: DC
Start: 2024-05-04 — End: 2024-05-05
  Administered 2024-05-04: 15 mg via ORAL

## 2024-05-04 MED ORDER — DIPHENHYDRAMINE HCL 25 MG PO CAPS
25 | Freq: Four times a day (QID) | ORAL | Status: DC | PRN
Start: 2024-05-04 — End: 2024-05-05

## 2024-05-04 MED ORDER — BISACODYL 5 MG PO TBEC
5 | ORAL_TABLET | Freq: Every day | ORAL | 0 refills | 5.00000 days | Status: AC | PRN
Start: 2024-05-04 — End: ?

## 2024-05-04 MED ORDER — DEXAMETHASONE SODIUM PHOSPHATE 4 MG/ML IJ SOLN
4 | Freq: Once | INTRAMUSCULAR | Status: DC | PRN
Start: 2024-05-04 — End: 2024-05-04
  Administered 2024-05-04: 17:00:00 4 via INTRAVENOUS

## 2024-05-04 MED ORDER — DIPHENHYDRAMINE HCL 50 MG/ML IJ SOLN
50 | Freq: Four times a day (QID) | INTRAMUSCULAR | Status: DC | PRN
Start: 2024-05-04 — End: 2024-05-05

## 2024-05-04 MED ORDER — ONDANSETRON HCL 4 MG/2ML IJ SOLN
4 | Freq: Once | INTRAMUSCULAR | Status: DC | PRN
Start: 2024-05-04 — End: 2024-05-04

## 2024-05-04 MED ORDER — ONDANSETRON HCL 4 MG/2ML IJ SOLN
4 | Freq: Once | INTRAMUSCULAR | Status: DC | PRN
Start: 2024-05-04 — End: 2024-05-04
  Administered 2024-05-04: 18:00:00 4 via INTRAVENOUS

## 2024-05-04 MED ORDER — ATORVASTATIN CALCIUM 10 MG PO TABS
10 | Freq: Every day | ORAL | Status: DC
Start: 2024-05-04 — End: 2024-05-05
  Administered 2024-05-04 – 2024-05-05 (×2): 10 mg via ORAL

## 2024-05-04 MED ORDER — TIZANIDINE HCL 4 MG PO TABS
4 | ORAL_TABLET | Freq: Four times a day (QID) | ORAL | 0 refills | 30.00000 days | Status: AC | PRN
Start: 2024-05-04 — End: ?

## 2024-05-04 MED ORDER — GLYCOPYRROLATE 1 MG/5ML IV SOSY
1 | Freq: Once | INTRAVENOUS | Status: DC | PRN
Start: 2024-05-04 — End: 2024-05-04
  Administered 2024-05-04: 17:00:00 .1 via INTRAVENOUS
  Administered 2024-05-04: 17:00:00 .2 via INTRAVENOUS
  Administered 2024-05-04: 17:00:00 .1 via INTRAVENOUS

## 2024-05-04 MED ORDER — ROPIVACAINE HCL 5 MG/ML IJ SOLN
INTRAMUSCULAR | Status: DC | PRN
Start: 2024-05-04 — End: 2024-05-04
  Administered 2024-05-04: 18:00:00 50 via INTRAMUSCULAR

## 2024-05-04 MED ORDER — OXYCODONE HCL 5 MG PO TABS
5 | Freq: Once | ORAL | Status: AC | PRN
Start: 2024-05-04 — End: 2024-05-04
  Administered 2024-05-04: 21:00:00 5 mg via ORAL

## 2024-05-04 MED ORDER — NORMAL SALINE FLUSH 0.9 % IV SOLN
0.9 | INTRAVENOUS | Status: DC | PRN
Start: 2024-05-04 — End: 2024-05-04

## 2024-05-04 MED ORDER — KETOROLAC TROMETHAMINE 30 MG/ML IJ SOLN
30 | Freq: Four times a day (QID) | INTRAMUSCULAR | Status: DC
Start: 2024-05-04 — End: 2024-05-05
  Administered 2024-05-04 – 2024-05-05 (×3): 15 mg via INTRAVENOUS

## 2024-05-04 MED ORDER — PHENYLEPHRINE HCL (PRESSORS) 10 MG/ML IV SOLN
10 | INTRAVENOUS | Status: AC
Start: 2024-05-04 — End: ?

## 2024-05-04 MED ORDER — SODIUM CHLORIDE 0.9 % IV SOLN
0.9 | INTRAVENOUS | Status: DC | PRN
Start: 2024-05-04 — End: 2024-05-05

## 2024-05-04 MED ORDER — NORMAL SALINE FLUSH 0.9 % IV SOLN
0.9 | Freq: Two times a day (BID) | INTRAVENOUS | Status: DC
Start: 2024-05-04 — End: 2024-05-05
  Administered 2024-05-04 – 2024-05-05 (×2): 10 mL via INTRAVENOUS

## 2024-05-04 MED ORDER — GABAPENTIN 300 MG PO CAPS
300 | ORAL_CAPSULE | Freq: Three times a day (TID) | ORAL | 0 refills | 30.00000 days | Status: AC
Start: 2024-05-04 — End: 2024-05-18

## 2024-05-04 MED ORDER — PROPOFOL 1000 MG/100ML IV EMUL
1000 | INTRAVENOUS | Status: AC
Start: 2024-05-04 — End: ?

## 2024-05-04 MED ORDER — TAMSULOSIN HCL 0.4 MG PO CAPS
0.4 | Freq: Every day | ORAL | Status: AC
Start: 2024-05-04 — End: 2024-05-04
  Administered 2024-05-04: 14:00:00 0.4 mg via ORAL

## 2024-05-04 MED ORDER — PHENYLEPHRINE HCL 1 MG/10ML IV SOSY
1 | Freq: Once | INTRAVENOUS | Status: DC | PRN
Start: 2024-05-04 — End: 2024-05-04
  Administered 2024-05-04 (×3): 200 via INTRAVENOUS
  Administered 2024-05-04 (×2): 150 via INTRAVENOUS

## 2024-05-04 MED ORDER — SENNOSIDES 8.6 MG PO TABS
8.6 | Freq: Every day | ORAL | Status: DC | PRN
Start: 2024-05-04 — End: 2024-05-05

## 2024-05-04 MED ORDER — ONDANSETRON HCL 4 MG/2ML IJ SOLN
4 | Freq: Four times a day (QID) | INTRAMUSCULAR | Status: DC | PRN
Start: 2024-05-04 — End: 2024-05-05

## 2024-05-04 MED ORDER — ASPIRIN 81 MG PO TBEC
81 | Freq: Two times a day (BID) | ORAL | Status: DC
Start: 2024-05-04 — End: 2024-05-05
  Administered 2024-05-05: 13:00:00 81 mg via ORAL

## 2024-05-04 MED ORDER — HYDROMORPHONE HCL PF 2 MG/ML IJ SOLN
2 | INTRAMUSCULAR | Status: DC | PRN
Start: 2024-05-04 — End: 2024-05-05
  Administered 2024-05-04 – 2024-05-05 (×2): 1 mg via INTRAVENOUS

## 2024-05-04 MED ORDER — OXYCODONE HCL 5 MG PO TABS
5 | ORAL | Status: DC | PRN
Start: 2024-05-04 — End: 2024-05-05

## 2024-05-04 MED ORDER — OXYCODONE HCL 5 MG PO TABS
5 | ORAL_TABLET | ORAL | 0 refills | 5.00000 days | Status: AC | PRN
Start: 2024-05-04 — End: 2024-05-09

## 2024-05-04 MED FILL — BACLOFEN 10 MG PO TABS: 10 mg | ORAL | Qty: 1 | Fill #0

## 2024-05-04 MED FILL — TRANEXAMIC ACID 1000 MG/10ML IV SOLN: 1000 MG/10ML | INTRAVENOUS | Qty: 24 | Fill #0

## 2024-05-04 MED FILL — ACETAMINOPHEN EXTRA STRENGTH 500 MG PO TABS: 500 mg | ORAL | Qty: 2 | Fill #0

## 2024-05-04 MED FILL — LAMOTRIGINE 25 MG PO TABS: 25 mg | ORAL | Qty: 2 | Fill #0

## 2024-05-04 MED FILL — CELECOXIB 100 MG PO CAPS: 100 mg | ORAL | Qty: 2 | Fill #0

## 2024-05-04 MED FILL — MIDAZOLAM HCL 2 MG/2ML IJ SOLN: 2 mg/mL | INTRAMUSCULAR | Qty: 2 | Fill #0

## 2024-05-04 MED FILL — PHENYLEPHRINE HCL (PRESSORS) 1 MG/10ML IV SOSY: 1 MG/0ML | INTRAVENOUS | Qty: 10 | Fill #0

## 2024-05-04 MED FILL — BUSPIRONE HCL 10 MG PO TABS: 10 mg | ORAL | Qty: 1 | Fill #0

## 2024-05-04 MED FILL — ONDANSETRON HCL 4 MG/2ML IJ SOLN: 4 MG/2ML | INTRAMUSCULAR | Qty: 2 | Fill #0

## 2024-05-04 MED FILL — ATORVASTATIN CALCIUM 10 MG PO TABS: 10 mg | ORAL | Qty: 1 | Fill #0

## 2024-05-04 MED FILL — HYDROMORPHONE HCL 2 MG/ML IJ SOLN: 2 mg/mL | INTRAMUSCULAR | Qty: 1 | Fill #0

## 2024-05-04 MED FILL — PROPOFOL 1000 MG/100ML IV EMUL: 1000 MG/100ML | INTRAVENOUS | Qty: 100 | Fill #0

## 2024-05-04 MED FILL — KETAMINE HCL 50 MG/5ML IJ SOSY: 50 MG/5ML | INTRAMUSCULAR | Qty: 5 | Fill #0

## 2024-05-04 MED FILL — OXYCODONE HCL 5 MG PO TABS: 5 mg | ORAL | Qty: 1 | Fill #0

## 2024-05-04 MED FILL — KETOROLAC TROMETHAMINE 30 MG/ML IJ SOLN: 30 mg/mL | INTRAMUSCULAR | Qty: 1 | Fill #0

## 2024-05-04 MED FILL — BUPIVACAINE SPINAL 0.75-8.25 % IT SOLN: 0.75-8.25 % | INTRATHECAL | Qty: 2 | Fill #0

## 2024-05-04 MED FILL — OLANZAPINE 5 MG PO TABS: 5 mg | ORAL | Qty: 3 | Fill #0

## 2024-05-04 MED FILL — FENTANYL CITRATE (PF) 100 MCG/2ML IJ SOLN: 100 MCG/2ML | INTRAMUSCULAR | Qty: 2 | Fill #0

## 2024-05-04 MED FILL — FLUOXETINE HCL 10 MG PO CAPS: 10 mg | ORAL | Qty: 1 | Fill #0

## 2024-05-04 MED FILL — TAMSULOSIN HCL 0.4 MG PO CAPS: 0.4 mg | ORAL | Qty: 1 | Fill #0

## 2024-05-04 MED FILL — DEXAMETHASONE SODIUM PHOSPHATE 4 MG/ML IJ SOLN: 4 mg/mL | INTRAMUSCULAR | Qty: 1 | Fill #0

## 2024-05-04 MED FILL — PHENYLEPHRINE HCL (PRESSORS) 10 MG/ML IV SOLN: 10 mg/mL | INTRAVENOUS | Qty: 1 | Fill #0

## 2024-05-04 MED FILL — ACETAMINOPHEN 325 MG PO TABS: 325 mg | ORAL | Qty: 2 | Fill #0

## 2024-05-04 MED FILL — SODIUM CHLORIDE (PF) 0.9 % IJ SOLN: 0.9 % | INTRAMUSCULAR | Qty: 30 | Fill #0

## 2024-05-04 MED FILL — GLYCOPYRROLATE 1 MG/5ML IV SOSY: 1 MG/5ML | INTRAVENOUS | Qty: 5 | Fill #0

## 2024-05-04 MED FILL — NAROPIN 5 MG/ML IJ SOLN: 5 mg/mL | INTRAMUSCULAR | Qty: 30 | Fill #0

## 2024-05-04 MED FILL — CEFAZOLIN SODIUM 3 G IV SOLR: 3 g | INTRAVENOUS | Qty: 3000 | Fill #0

## 2024-05-04 NOTE — Anesthesia Pre-Procedure Evaluation (Signed)
 Department of Anesthesiology  Preprocedure Note       Name:  Richard Dennis   Age:  58 y.o.  DOB:  Aug 08, 1966                                          MRN:  [id number]         Date:  05/04/2024      Surgeon: Clotilde):  Marykay Hamilton, MD    Procedure: Procedure(s):  RIGHT TOTAL HIP ARTHROPLASTY [phone]    Medications prior to admission:   Prior to Admission medications    Medication Sig Start Date End Date Taking? Authorizing Provider   ibuprofen (ADVIL;MOTRIN) 800 MG tablet Take 1 tablet by mouth every 8 hours as needed for Pain 01/05/24  Yes [provider]   baclofen  (LIORESAL ) 10 MG tablet Take 1 tablet by mouth as needed 10/18/23  Yes [provider]   atorvastatin  (LIPITOR ) 10 MG tablet TAKE 1 TABLET BY MOUTH EVERY DAY 03/24/23  Yes Lumpkin, Jeanne B, MD   omeprazole  (PRILOSEC) 20 MG delayed release capsule TAKE 1 CAPSULE BY MOUTH EVERY DAY  Patient taking differently: Take 1 capsule by mouth daily 03/24/23  Yes Lumpkin, Jeanne B, MD   busPIRone  (BUSPAR ) 10 MG tablet Take 1 tablet by mouth 2 times daily 11/24/22  Yes Parish, Jayson POUR, MD   Magnesium  400 MG CAPS Take 400 capsules by mouth every morning   Yes [provider]   OLANZapine  (ZYPREXA ) 15 MG tablet Take 1 tablet by mouth nightly 10/22/22  Yes Ima Jayson POUR, MD   albuterol  sulfate HFA (PROVENTIL ;VENTOLIN ;PROAIR ) 108 (90 Base) MCG/ACT inhaler Inhale 2 puffs into the lungs every 4 hours as needed for Wheezing 09/30/22  Yes Carnes, Tinnie Agreste, PA-C   FLUoxetine  (PROZAC ) 10 MG capsule Take 1 capsule by mouth daily 09/30/22  Yes Carnes, Lauren Ashton, PA-C   lamoTRIgine  (LAMICTAL ) 150 MG tablet Take 1 tablet by mouth daily 09/30/22  Yes Carnes, Tinnie Agreste, PA-C       Current medications:    Current Facility-Administered Medications   Medication Dose Route Frequency Provider Last Rate Last Admin   . tranexamic acid  (CYKLOKAPRON ) 2,400 mg in sodium chloride  0.9 % 100 mL IVPB  2,400 mg IntraVENous On Call to OR McCurry, Koren Moose, PA-C        . 0.9 % sodium chloride  infusion   IntraVENous Continuous McCurry, Koren Moose, PA-C       . sodium chloride  flush 0.9 % injection 5-40 mL  5-40 mL IntraVENous 2 times per day Governor Koren Moose, PA-C       . sodium chloride  flush 0.9 % injection 5-40 mL  5-40 mL IntraVENous PRN McCurry, Koren Moose, PA-C       . 0.9 % sodium chloride  infusion   IntraVENous PRN McCurry, Koren Moose, PA-C       . ceFAZolin  (ANCEF ) 3,000 mg in sodium chloride  0.9 % 100 mL (mini-bag)  3,000 mg IntraVENous On Call to OR McCurry, Koren Moose, PA-C           Allergies:  No Known Allergies    Problem List:    Patient Active Problem List   Diagnosis Code   . Asthma J45.909   . Obesity, morbid, BMI 40.0-49.9 (HCC) E66.01   . Dyslipidemia E78.5   . Hyperglycemia R73.9   . Prediabetes R73.03   . Bipolar 1  disorder, depressed, partial remission (HCC) F31.75   . Hypercholesteremia E78.00   . Snoring R06.83   . GERD without esophagitis K21.9   . Impaired glucose tolerance R73.02   . S/P lumbar fusion Z98.1   . 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 Avascular necrosis of bone of right hip (HCC) M87.051       Past Medical History:        Diagnosis Date   . Anxiety and depression    . Asthma    . GERD (gastroesophageal reflux disease)    . Hyperlipidemia    . Obesity    . Right hip pain        Past Surgical History:        Procedure Laterality Date   . BICEPS TENDON REPAIR Right    . CARPAL TUNNEL RELEASE Bilateral    . CERVICAL SPINE SURGERY  2015    3 level ACDF   . CHOLECYSTECTOMY, LAPAROSCOPIC     . COLONOSCOPY N/A 06/28/2023    COLONOSCOPY BIOPSY performed by Ladean Carliss Rush, MD at [facility] ENDOSCOPY   . LUMBAR SPINE SURGERY      x 2 fusions   . PAIN MANAGEMENT PROCEDURE N/A 01/25/2024    LESI performed by Floretta Lamar PARAS, MD at [facility] CORNER AMBULATORY OR   . SHOULDER ARTHROSCOPY Right        Social History:    Social History     Tobacco Use   . Smoking status: Former     Current packs/day: 0.00     Average  packs/day: 1 pack/day for 20.0 years (20.0 ttl pk-yrs)     Types: Cigarettes     Start date: 08/2002     Quit date: 08/2022     Years since quitting: 1.6   . Smokeless tobacco: Never   Substance Use Topics   . Alcohol use: Not Currently     Comment: quit in 2003                                Counseling given: Not Answered      Vital Signs (Current):   Vitals:    04/21/24 1204 05/04/24 0845   BP:  (!) 148/98   Pulse:  77   Resp:  18   Temp:  97.7 F (36.5 C)   TempSrc:  Temporal   SpO2:  92%   Weight: 126.1 kg (278 lb) 128.8 kg (283 lb 15.2 oz)   Height: 1.727 m (5' 8) 1.727 m (5' 7.99)                                              BP Readings from Last 3 Encounters:   05/04/24 (!) 148/98   01/25/24 120/79   06/28/23 118/77       NPO Status: Time of last liquid consumption: 0700                        Time of last solid consumption: 2200                        Date of last liquid consumption: 05/03/24  Date of last solid food consumption: 05/03/24    BMI:   Wt Readings from Last 3 Encounters:   05/04/24 128.8 kg (283 lb 15.2 oz)   04/22/24 126.1 kg (278 lb)   03/22/24 126.1 kg (278 lb)     Body mass index is 43.18 kg/m.    CBC:   Lab Results   Component Value Date/Time    WBC 9.3 04/20/2024 02:26 PM    RBC 4.50 04/20/2024 02:26 PM    HGB 13.8 04/20/2024 02:26 PM    HCT 43.6 04/20/2024 02:26 PM    MCV 96.9 04/20/2024 02:26 PM    RDW 13.2 04/20/2024 02:26 PM    PLT 229 04/20/2024 02:26 PM       CMP:   Lab Results   Component Value Date/Time    NA 139 04/20/2024 02:26 PM    K 4.4 04/20/2024 02:26 PM    CL 100 04/20/2024 02:26 PM    CO2 29 04/20/2024 02:26 PM    BUN 14 04/20/2024 02:26 PM    CREATININE 1.1 04/20/2024 02:26 PM    LABGLOM 79 04/20/2024 02:26 PM    LABGLOM 110 09/29/2022 07:18 AM    GLUCOSE 118 04/20/2024 02:26 PM    CALCIUM  9.2 04/20/2024 02:26 PM    BILITOT 0.21 04/20/2024 02:26 PM    ALKPHOS 89 04/20/2024 02:26 PM    AST 60 04/20/2024 02:26 PM    ALT 33 04/20/2024 02:26 PM        POC Tests: No results for input(s): POCGLU, POCNA, POCK, POCCL, POCBUN, POCHEMO, POCHCT in the last 72 hours.    Coags: No results found for: PROTIME, INR, APTT    HCG (If Applicable): No results found for: PREGTESTUR, PREGSERUM, HCG, HCGQUANT     ABGs: No results found for: PHART, PO2ART, PCO2ART, HCO3ART, BEART, O2SATART     Type & Screen (If Applicable):  No results found for: ABORH, LABANTI    Drug/Infectious Status (If Applicable):  No results found for: HIV, HEPCAB    COVID-19 Screening (If Applicable):   Lab Results   Component Value Date/Time    COVID19 Detected 09/24/2022 01:19 PM           Anesthesia Evaluation  Patient summary reviewed and Nursing notes reviewed  Airway: Mallampati: III  TM distance: >3 FB   Neck ROM: full  Mouth opening: > = 3 FB   Dental:    (+) upper dentures      Pulmonary:normal exam    (+)           asthma:                            Cardiovascular:    (+) hyperlipidemia                  Neuro/Psych:   (+) psychiatric history:depression/anxiety              ROS comment: S/P lumbar fusion GI/Hepatic/Renal:   (+) GERD: well controlled, morbid obesity          Endo/Other:    (+) : arthritis: OA.SABRA                 Abdominal:             Vascular: negative vascular ROS.         Other Findings:       Anesthesia Plan      general and spinal  ASA 3           MIPS: Postoperative opioids intended and Prophylactic antiemetics administered.  Anesthetic plan and risks discussed with patient.      Plan discussed with CRNA.                Curtistine Jungling, MD   05/04/2024

## 2024-05-04 NOTE — Anesthesia Post-Procedure Evaluation (Signed)
 Department of Anesthesiology  Postprocedure Note    Patient: Richard Dennis  MRN: [id number]  Birthdate: Dec 30, 1965  Date of evaluation: 05/04/2024    Procedure Summary       Date: 05/04/24 Room / Location: [facility] OR 02 / [facility] MAIN OR    Anesthesia Start: 1233 Anesthesia Stop: 1437    Procedure: RIGHT TOTAL HIP ARTHROPLASTY 27130 (Right) Diagnosis:       Avascular necrosis of bone of hip, right (HCC)      (Avascular necrosis of bone of hip, right (HCC) [M87.051])    Surgeons: Marykay Hamilton, MD Responsible Provider: Onita Faden, MD    Anesthesia Type: Spinal, TIVA ASA Status: 3            Anesthesia Type: Spinal, TIVA    Aldrete Phase I: Aldrete Score: 9    Aldrete Phase II:      Anesthesia Post Evaluation    Patient location during evaluation: PACU  Patient participation: complete - patient participated  Level of consciousness: awake and alert  Airway patency: patent  Nausea & Vomiting: no nausea and no vomiting  Cardiovascular status: blood pressure returned to baseline  Respiratory status: acceptable  Hydration status: euvolemic  Comments: Vitals Reviewed  Pain management: adequate    No notable events documented.

## 2024-05-04 NOTE — Anesthesia Procedure Notes (Signed)
 Spinal Block    Patient location during procedure: OR  End time: 05/04/2024 12:47 PM  Reason for block: primary anesthetic and at surgeon's request  Staffing  Performed: anesthesiologist   Anesthesiologist: Onita Faden, MD  Resident/CRNA: Omelia Lucie NOVAK, APRN - CRNA  Performed by: Omelia Lucie NOVAK, APRN - CRNA  Authorized by: Onita Faden, MD    Spinal Block  Patient position: sitting  Prep: ChloraPrep  Patient monitoring: cardiac monitor, continuous pulse ox, continuous capnometry, frequent blood pressure checks and oxygen  Approach: midline  Location: L2/L3  Provider prep: mask and sterile gloves  Local infiltration: lidocaine   Needle  Needle type: Pencan   Needle gauge: 24 G  Needle length: 4 in  Assessment  Sensory level: T6  Events: cerebrospinal fluid  Swirl obtained: Yes  CSF: clear  Attempts: 2  Hemodynamics: stable  Additional Notes  Lot: [id number]  Exp: 2026-06-27    First attempt by CRNA, second by MDA  Preanesthetic Checklist  Completed: patient identified, IV checked, site marked, risks and benefits discussed, surgical/procedural consents, equipment checked, pre-op evaluation, timeout performed, anesthesia consent given, oxygen available, monitors applied/VS acknowledged, fire risk safety assessment completed and verbalized and blood product R/B/A discussed and consented

## 2024-05-04 NOTE — Op Note (Signed)
 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 Assistance was medically necessary in order to appropriately position the thigh, dislocate and relocate the hip joint, mobilize the extremity, retract soft tissues and expose the femur and acetabulum.     Anesthesia: Spinal    Estimated Blood Loss (mL): 400    Complications: None    Specimens:   ID Type Source Tests Collected by Time Destination   A : Right Femoral Head Bone Joint, Hip SURGICAL PATHOLOGY Marykay Hamilton, MD 05/19/2024 1337        Implants:  Implant Name Type Inv. Item Serial No. Manufacturer Lot No. LRB No. Used Action   SHELL ACET CMNTLS 54 MM 3 HOLE STRL EMPHASYS LTX - [id number]  SHELL ACET CMNTLS 54 MM 3 HOLE STRL EMPHASYS LTX  [employer] [id number] Right 1 Implanted   LINER ACET NEUT 40X54 MM AOX POLYETH STRL EMPHASYS LTX - [id number]  LINER ACET NEUT 40X54 MM AOX POLYETH STRL EMPHASYS LTX  [employer] [id number] Right 1 Implanted   STEM FEM HI OFFSET 6 107 MM HIP CLLRLSS DUOFIX ACTIS - [id number]  STEM FEM HI OFFSET 6 107 MM HIP CLLRLSS DUOFIX ACTIS  [employer] [id number] Right 1 Implanted   HEAD FEM 8.5+ MM 40 MM STRL ARTC EZ LTX - [id number]  HEAD FEM 8.5+ MM 40 MM STRL ARTC EZ LTX  [employer] [id number] Right 1 Implanted         Drains: * No LDAs found *    Findings:  Present At Time Of Surgery (PATOS) (choose all levels that have infection present):  No infection present  Other  Findings: avascular necrosis    Detailed Description of Procedure:     Clinical History/Indication for Surgery    This is a 58 y.o.-year-old patient with a long history of severe right hip pain secondary to avascular necrosis with femoral head collapse.  After review of the appropriate imaging studies, a detailed physical examination and history, it was recommended that the patient undergo right total hip arthroplasty as the patient had failed conservative management.  Patient is at increased risk of infection given his BMI is over 40.  Patient is very debilitated by his avascular necrosis and would like to proceed with hip replacement understanding these risks.    The risks and benefits of hip arthroplasty have been discussed with the patient which include, but are not limited to, infections (including severe sequelae), potential component failure, fracture, DVT, pulmonary embolus, nerve palsy, dislocation, leg length inequality, persistent pain, wound healing complications, transfusions and death.       Operative Procedure:    Patient was seen and examined in preoperative holding area. Surgical site was marked and patient was confirmed via ID bracelet. Consent was reviewed. The patient was brought back to operating theater and placed in supine position. Anesthesia was administered. Time  out was performed. Preop antibiotics were given per protocol. The patient was placed in the lateral recumbent position, operative hip up, pelvis placed in neutral. All areas were well padded. The operative lower extremity was prepped and draped in normal sterile fashion.    Total hip arthroplasty was performed via a modified lateral approach. A longitudinal incision was centered over the greater trochanter. The incision was made through the subcutaneous tissues, down to the IT band. The IT band was incised in a longitudinal fashion in line with the center of the femur and greater trochanter. Retractors were placed. The anterior 33%  of the abductors were subperiosteally dissected off the greater trochanter. The capsule was excised. The femoral head was easily dislocated anteriorly. A neck cut was performed based off of our template. Attention was then placed to the acetabulum.    Our acetabular retractors were carefully placed and the labrum was removed and excised without difficulty. A small anterior and inferior capsular release was performed. We ensured careful reaming with the goal to get good circumferential rim fit with a nice concentric cancellous bone bed. The acetabular component was then impacted which was well fixed and seated. Overall stability of the cup was excellent. This was placed in approximately 35-40 degrees of abduction and 20-25 degrees of anteversion. The osteophytes around the acetabular shell and acetabulum were removed with a 1/2-inch curved osteotome. The acetabular liner was then impacted into the metal shell with good lock. Attention was then placed to the femur.    The retractors were placed carefully around the proximal femur. The box osteotome was used to gain access to the canal followed by the canal finder by hand. The femur was serially broached up with the Actis broaches. The final broach had excellent fit, pitch change with malleting, and rotational control and no advancement with each additional tap of the mallet. A trial reduction was performed. There was overall excellent stability, alignment, and excellent rotation/motion. No impingement and excellent limb length equality. The trials were removed and the real femoral prosthesis was impacted. The hip had excellent fit, fill, rotational stability, and differential pitch change, and no advancement of the stem with each tap of the mallet and the stem was the exact height as our broach. Therefore, we cleansed the trunnion and impacted the real femoral head ball. We successfully reduced the hip. There was no impingement with excellent range of motion.    The  wound was copiously irrigated and we injected our joint cocktail in the capsule and subcutaneous tissues. The abductors were closed with two #5 Ethibond Krackow stitches down through bone tunnels on the greater trochanter, followed by a running #2 Quill. The IT band with #1 Vicryl, followed by a #2 running Quill. The wound was copiously irrigated again. Hemostasis was easily achieved. The subcutaneous tissues was closed with 2-0 Vicryl, skin with 3-0 Monocryl, surgical glue, and sterile dressing.    The patient tolerated the procedure well without complication. All operative counts were correct.      Post-Operative Plan:    PT, weight bearing as tolerated with assistive device, no active abduction for 6 weeks  Avoid extreme range of motion  DVT Prophylaxis: Aspirin  81mg  BID and SCDs  Antibiotics x24hrs  Pain control  AP pelvis X-ray in PACU  Follow-up in 4 weeks     Electronically signed by Glendia Belts, MD on 05/04/2024 at 2:34 PM

## 2024-05-04 NOTE — Progress Notes (Cosign Needed)
 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 male admitted with Avascular necrosis of bone of hip, right (HCC). He  has a past medical history of Anxiety and depression, Asthma, GERD (gastroesophageal reflux disease), Hyperlipidemia, Obesity, and Right hip pain.  He also  has a past surgical history that includes Lumbar spine surgery; Cervical spine surgery (2015); Carpal tunnel release (Bilateral); Biceps tendon repair (Right); Cholecystectomy, laparoscopic; Shoulder arthroscopy (Right); Colonoscopy (N/A, 06/28/2023); and Pain management procedure (N/A, 01/25/2024).    Subjective:   Pt seen in room. Pt has not urinated yet    Additional Pertinent History: Pt with hx of LR hip AVN s/p R THA via mod Lateral approach      Social Environment Prior Level of Function   Lives With: Family  Type of Home: House  Home Layout: One level  Home Access: Stairs to enter with rails  Bathroom Shower/Tub: Medical sales representative:  (comfort height)  Oceanographer: Grab bars around toilet  Home Equipment: [place], Environmental consultant - Electronics engineer - Number of Steps: 3  Entrance Stairs - Rails: Both  Prior Level of Assist for ADLs: Independent  Prior Level of Assist for Homemaking: Independent  Homemaking  Responsibilities: Yes  Prior Level of Assist for Transfers: Independent  Active Driver: Yes  Type of Occupation: Hasnt work since june 2024   History of Falls: Yes (3-4)    Comments:        OBJECTIVE     Vital Signs/Pain Lines/Drains/Precautions   Vital Signs      Pain: 8-9/10 R hip  Pain Intervention: Ambulation/increased activity , Cold pack, and Repositioned Peripheral IV 05/04/24 Posterior;Right Hand (Active)       Precautions/Restrictions  Restrictions/Precautions  Restrictions/Precautions: Weight Bearing  Lower Extremity Weight Bearing Restrictions  Right Lower Extremity Weight Bearing: Weight Bearing As Tolerated  Position Activity Restriction  Hip Precautions: No active ABduction     Orientation/Cognition Vision/Hearing   Overall Cognitive Status: WNL  Overall Orientation Status: Within Normal Limits  Vision: Impaired  Vision Exceptions: Wears glasses at all times  Hearing: Within Functional Limits      Objective Assessment   Left Right   Gross ROM WFL Limited by  R THA   Strength 5/5 Moves against gravity   Sensation [facility] WFL   Balance good      Comments:      FUNCTIONAL ACTIVITY     MOBILITY LEVEL Complete  I Mod I Supervision/Setup Min A Mod A Max A Comments   Roll Left []  []  []  []  []  []     Roll Right []  []  []  []  []  []     Supine to Sit []  []  [x]  []  []  []  Use of rail   Sit to  Supine []  []  []  []  []  []  Left up in chair   Scooting []  []  []  []  []  []     Edge of Bed Assist []  []  [x]  []  []  []     Transfer Sit to Stand []  []  [x]  []  []  []  Cues for hand placement   Transfer Stand to Sit []  []  [x]  []  []  []     Transfer Bed to/from Chair []  []  []  []  []  []     Transfer Toilet []  []  []  []  []  []       Ambulation    Pt ab in room x 15 ft with RW with SPV with progression to step through pattern,     Therapeutic Activity (15 Minutes) CPT 97530: Therapeutic activity included Supine to Sit, Sit to Supine, Ambulation on level ground, Sitting balance , and Standing balance to improve functional Activity tolerance, Balance,  Mobility, Strength, and ROM.  Safety: Type of Devices: Left in chair;Nurse notified;Call light within reach     Education:  Pt ed on R LE WBAT as well as avoid extreme ROM and no Active R hip abd x 6 weeks. Pt verbalized understanding. Pt ed on and performed Antiembolic there ex to include L AAROM heelslides , ankle pumps, glute sets and quad sets x 10 reps. Pt demo understanding     [employer] AM-PACT "6 Clicks" Basic Mobility Inpatient Short Form   How much help is needed turning from your back to your side while in a flat bed without using bedrails?: A Little  How much help is needed moving from lying on your back to sitting on the side of a flat bed without using bedrails?: A Little  How much help is needed moving to and from a bed to a chair?: A Little  How much help is needed standing up from a chair using your arms?: A Little  How much help is needed walking in hospital room?: A Little  How much help is needed climbing 3-5 steps with a railing?: A Little  AM-PAC Inpatient Mobility Raw Score : 18  AM-PAC Inpatient T-Scale Score : 43.63  Mobility Inpatient CMS 0-100% Score: 46.58  Mobility Inpatient CMS G-Code Modifier : CK     ASSESSMENT   Assessment:    Pt presents in bed with no acute signs of distress. Pt complains of 8/10 right hip pain. Evaluation initiation with reviewing WBAT right lower extremity and no active abduction. Therapist introduces HEP and pt completes all therapeutic exercises with increased time. Next, pt performs supine-to-sit transfer with supervision,with use of rail. Pt denies dizziness with upright posture. Therapist then introduces rolling walker and provides cueing for hand placement and sequencing. Pt stands with supervision. Therapist providing cueing for upright posture. Pt then ambulates 49ft with supervision and a rolling walker . Therapist providing verbal and tactile cueing for progression to step through pattern. At conclusion of session therapist reviews importance of  icing hip 7x/daily for 20 minutes at a time, standing/walking every hour and performing HEP 3x/daily. All pt questions/concerns addressed.        Evaluation Complexity: Low Complexity      POST ACUTE RECOMMENDATIONS   Setting: Home Health Therapy    Justification for Recommended Setting: Recommended to help patient obtain maximum functional independence.     Equipment: Equipment Needed: No     Discharge Transportation Recommendations: No stretcher     PLAN   Frequency & Duration: BID for duration of hospital stay or until stated goals are met, whichever comes first. Patient  has not yet achieved functional mobility goals due to pain management, gait instability, and safety concerns, requires additional skilled therapy services     Richard Dennis presents with Excellent therapy prognosis. Skilled intervention is medically necessary to address the following performance deficits:Decreased functional mobility , Decreased ADL status, Decreased ROM, Decreased strength, Decreased posture, Increased pain, Decreased balance, Decreased sensation, Decreased tolerance to work activity.  Benefits and precautions of physical therapy have been discussed with Richard Dennis, and the following interventions are recommended: Strengthening, ROM, Balance training, Functional mobility training, Transfer training, Endurance training, IADL training, ADL/Self-care training, Gait training, Stair training, Neuromuscular re-education, Pain management, Manual, Return to work related activity, Home exercise program, Safety education & training, Equipment evaluation, education, & procurement, Patient/Caregiver education & training, Modalities, Therapeutic activities     Goals    Short Term Goals Long Term Goals   Time Frame for Short Term Goals: 2 sessions  Short Term Goal 1: Pt will perform bed mob with SPV while maintaining proper precaution of no active hip abduction without rail  Short Term Goal 2: Pt will perform sit to stand with mod I with RW   and WBAT  Short Term Goal 3: Pt will amb x 150 ft with RW with SPV and WBAT  Short Term Goal 4: Pt will perform antiembolic ther ex HEP with mod I with handout provided Time Frame for Long Term Goals : 4 sessions  Long Term Goal 1: Pt will perform bed mob with mod I while maintaining proper precaution of no active hip abduction.  Long Term Goal 2: Pt will perform sit to stand  with WBAT with  I  Long Term Goal 3: Pt  will amb  x 300 ft with RW WBAT with mod I  Long Term Goal 4: Pt will ascend and descend 4 steps with non reciprocal gait with SPV and B  Rail      Therapist Signature: Sharlet Earnie Bourgeois, PT    Date: 05/04/2024

## 2024-05-04 NOTE — Plan of Care (Signed)
 Problem: Pain  Goal: Verbalizes/displays adequate comfort level or baseline comfort level  Outcome: Progressing     Problem: Discharge Planning  Goal: Discharge to home or other facility with appropriate resources  Outcome: Progressing  Flowsheets (Taken 05/04/2024 1749)  Discharge to home or other facility with appropriate resources: Identify barriers to discharge with patient and caregiver     Problem: Safety - Adult  Goal: Free from fall injury  Outcome: Progressing     Problem: ABCDS Injury Assessment  Goal: Absence of physical injury  Outcome: Progressing

## 2024-05-04 NOTE — Interval H&P Note (Signed)
 Update History & Physical    The patient's History and Physical of April 21, 2024 was reviewed with the patient and I examined the patient. There was no change. The surgical site was confirmed by the patient and me.     Plan: The risks, benefits, expected outcome, and alternative to the recommended procedure have been discussed with the patient. Patient understands and wants to proceed with the procedure.     Electronically signed by Glendia Belts, MD on 05/04/2024 at 9:25 AM

## 2024-05-05 LAB — CBC WITH AUTO DIFFERENTIAL
Basophils %: 0.1 % (ref 0.0–2.0)
Basophils Absolute: 0 x10e3/mcL (ref 0.0–0.2)
Eosinophils %: 0 % (ref 0.0–7.0)
Eosinophils Absolute: 0 x10e3/mcL (ref 0.0–0.5)
Hematocrit: 41.2 % (ref 38.0–52.0)
Hemoglobin: 13.2 g/dL (ref 13.0–17.3)
Immature Grans (Abs): 0.05 x10e3/mcL (ref 0.00–0.06)
Immature Granulocytes %: 0.3 % (ref 0.0–0.6)
Lymphocytes Absolute: 1.6 x10e3/mcL (ref 1.0–3.2)
Lymphocytes: 9 % — ABNORMAL LOW (ref 15.0–45.0)
MCH: 31.1 pg (ref 27.0–34.5)
MCHC: 32 g/dL (ref 30.0–36.0)
MCV: 97.2 fL (ref 84.0–100.0)
MPV: 9.9 fL (ref 7.0–12.2)
Monocytes %: 8.5 % (ref 4.0–12.0)
Monocytes Absolute: 1.5 x10e3/mcL — ABNORMAL HIGH (ref 0.3–1.0)
Neutrophils %: 82.1 % — ABNORMAL HIGH (ref 42.0–74.0)
Neutrophils Absolute: 14.7 x10e3/mcL — ABNORMAL HIGH (ref 1.6–7.3)
Platelets: 222 x10e3/mcL (ref 140–440)
RBC: 4.24 x10e6/mcL (ref 4.00–5.60)
RDW: 13.3 % (ref 10.0–17.0)
WBC: 18 x10e3/mcL — ABNORMAL HIGH (ref 3.8–10.6)

## 2024-05-05 LAB — BASIC METABOLIC PANEL
Anion Gap: 13 mmol/L (ref 2–17)
BUN: 17 mg/dL (ref 6–20)
CO2: 23 mmol/L (ref 22–29)
Calcium: 8.5 mg/dL (ref 8.5–10.7)
Chloride: 101 mmol/L (ref 98–107)
Creatinine: 1 mg/dL (ref 0.7–1.3)
Est, Glom Filt Rate: 88 mL/min/1.73mÂ² (ref >=60)
Glucose: 132 mg/dL — ABNORMAL HIGH (ref 70–99)
Osmolaliy Calculated: 277 mosm/kg (ref 270–287)
Potassium: 5 mmol/L (ref 3.5–5.3)
Sodium: 137 mmol/L (ref 135–145)

## 2024-05-05 MED FILL — CEFAZOLIN SODIUM 3 G IV SOLR: 3 g | INTRAVENOUS | Qty: 3000 | Fill #0

## 2024-05-05 MED FILL — OXYCODONE HCL 5 MG PO TABS: 5 mg | ORAL | Qty: 2 | Fill #0

## 2024-05-05 NOTE — Plan of Care (Signed)
 Problem: Musculoskeletal - Adult  Goal: Return mobility to safest level of function  05/05/2024 1145 by Myrna Parody, RN  Outcome: Adequate for Discharge  05/05/2024 0806 by Myrna Parody, RN  Outcome: Progressing  Goal: Maintain proper alignment of affected body part  05/05/2024 1145 by Myrna Parody, RN  Outcome: Adequate for Discharge  05/05/2024 0806 by Myrna Parody, RN  Outcome: Progressing     Problem: Respiratory - Adult  Goal: Achieves optimal ventilation and oxygenation  05/05/2024 1145 by Myrna Parody, RN  Outcome: Adequate for Discharge  05/05/2024 0806 by Myrna Parody, RN  Outcome: Progressing  05/05/2024 0805 by Myrna Parody, RN  Outcome: Progressing  05/05/2024 0336 by Waddell Shelling, RN  Outcome: Progressing  05/05/2024 0336 by Waddell Shelling, RN  Outcome: Progressing     Problem: ABCDS Injury Assessment  Goal: Absence of physical injury  05/05/2024 1145 by Myrna Parody, RN  Outcome: Adequate for Discharge  05/05/2024 0806 by Myrna Parody, RN  Outcome: Progressing  05/05/2024 0805 by Myrna Parody, RN  Outcome: Progressing  05/05/2024 0336 by Waddell Shelling, RN  Outcome: Progressing  05/05/2024 0336 by Waddell Shelling, RN  Outcome: Progressing     Problem: Safety - Adult  Goal: Free from fall injury  05/05/2024 1145 by Myrna Parody, RN  Outcome: Adequate for Discharge  05/05/2024 0806 by Myrna Parody, RN  Outcome: Progressing  05/05/2024 0805 by Myrna Parody, RN  Outcome: Progressing  05/05/2024 0336 by Waddell Shelling, RN  Outcome: Progressing  05/05/2024 0336 by Waddell Shelling, RN  Outcome: Progressing     Problem: Pain  Goal: Verbalizes/displays adequate comfort level or baseline comfort level  05/05/2024 1145 by Myrna Parody, RN  Outcome: Adequate for Discharge  05/05/2024 0806 by Myrna Parody, RN  Outcome: Progressing  05/05/2024 0805 by Myrna Parody, RN  Outcome: Progressing  05/05/2024 0336 by Waddell Shelling, RN  Outcome: Progressing  05/05/2024 0336 by Waddell Shelling, RN  Outcome: Progressing     Problem:  Discharge Planning  Goal: Discharge to home or other facility with appropriate resources  05/05/2024 1145 by Myrna Parody, RN  Outcome: Adequate for Discharge  05/05/2024 0806 by Myrna Parody, RN  Outcome: Progressing  05/05/2024 0805 by Myrna Parody, RN  Outcome: Progressing  Flowsheets (Taken 05/05/2024 0801)  Discharge to home or other facility with appropriate resources: Identify barriers to discharge with patient and caregiver  05/05/2024 0336 by Waddell Shelling, RN  Outcome: Progressing  05/05/2024 0336 by Waddell Shelling, RN  Outcome: Progressing

## 2024-05-05 NOTE — Progress Notes (Signed)
 Koren GEANNIE Littler, Mickey., PA-C  Phone [phone]  Fax [phone]    [facility]  [address]  [place], [place]  [address]  [place], [place]  [address]    Adult Hip and Knee Reconstruction Service      Patient Name:  Richard Dennis  Date of birth:  1965/10/18   MRN:  [id number]   Date: 05/05/24    Subjective:     Richard Dennis is a pleasant 58 y.o. male who is s/p right THA Lateral Approach on 05/04/2024. Patient is doing well following their surgical procedure with pain controlled and tolerating diet. Patient denies any fever, chills, chest pain, and SOB.     Medications:      sodium chloride  flush  5-40 mL IntraVENous 2 times per day    acetaminophen   650 mg Oral Q6H    ketorolac   15 mg IntraVENous Q6H    aspirin   81 mg Oral BID    atorvastatin   10 mg Oral Daily    baclofen   10 mg Oral TID    busPIRone   10 mg Oral BID    FLUoxetine   10 mg Oral Daily    lamoTRIgine   150 mg Oral Daily    Magnesium   400 capsule Oral QAM    OLANZapine   15 mg Oral Nightly    pantoprazole   40 mg Oral QAM AC       Physical Exam:     BP 118/70   Pulse 66   Temp 98.4 F (36.9 C) (Oral)   Resp 16   Ht 1.727 m (5' 7.99)   Wt 128.8 kg (283 lb 15.2 oz)   SpO2 94%   BMI 43.18 kg/m      Intake and Output Summary (Last 24 hours) at Date Time    Intake/Output Summary (Last 24 hours) at 05/05/2024 0831  Last data filed at 05/05/2024 0135  Gross per 24 hour   Intake 1458.58 ml   Output 1225 ml   Net 233.58 ml       General examination:  GENERAL APPEARANCE:   Well developed, well nourished male, AOx3, in no acute distress  PULMONOLOGY:   No acute distress, breathing comfortably on room air  MUSCULOSKELETAL:   Dressing C/D/I   No obvious deformity  Fires Quad/TA/EHL/GSC  NEUROVASCULAR:   Palpable DP/PT pulses   SILT Superficial Peroneal/Deep Peroneal/Tibial/Sural/Saphenous   Brisk Capillary Refill  Calves soft, nontender bilateral  No obvious evidence of DVT    Labs:     CBC:   Lab Results   Component Value Date/Time     WBC 18.0 05/05/2024 05:11 AM    RBC 4.24 05/05/2024 05:11 AM    HGB 13.2 05/05/2024 05:11 AM    HCT 41.2 05/05/2024 05:11 AM    MCV 97.2 05/05/2024 05:11 AM    MCH 31.1 05/05/2024 05:11 AM    MCHC 32.0 05/05/2024 05:11 AM    RDW 13.3 05/05/2024 05:11 AM    PLT 222 05/05/2024 05:11 AM    MPV 9.9 05/05/2024 05:11 AM     BMP:    Lab Results   Component Value Date/Time    NA 137 05/05/2024 05:11 AM    K 5.0 05/05/2024 05:11 AM    CL 101 05/05/2024 05:11 AM    CO2 23 05/05/2024 05:11 AM    BUN 17 05/05/2024 05:11 AM    CREATININE 1.0 05/05/2024 05:11 AM    CALCIUM  8.5 05/05/2024 05:11 AM    LABGLOM 88  05/05/2024 05:11 AM    LABGLOM 110 09/29/2022 07:18 AM    GLUCOSE 132 05/05/2024 05:11 AM       Imaging:     Radiological Procedure personally reviewed by me.    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  Post-op Right Total Hip Arthroplasty Lateral Approach    COMPARISON: March 22, 2024    FINDINGS: Right total hip arthroplasty. No evidence of hardware loosening or  failure. Postoperative changes of the lumbar spine incompletely assessed.    Impression  Expected immediate postoperative changes following right total hip arthroplasty.      Assessment:     Richard Dennis is a pleasant 58 y.o. male who is s/p right THA Lateral Approach POD 1.    Plan:     Continue PT  - WBAT on the operative side  - No forced/active hip abduction for 6 weeks  DVT Prophylaxis   - Aspirin    - Bilateral SCDs  Pain Management  D/C Planning  - May D/C Home with HHS once PT goals met     Follow-up:     4 weeks    Electronically signed by Koren GEANNIE Littler, Jr. PA-C 05/05/2024 at 8:31 AM.

## 2024-05-05 NOTE — Progress Notes (Signed)
 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 male admitted with Avascular necrosis of bone of hip, right (HCC). He  has a past medical history of Anxiety and depression, Asthma, GERD (gastroesophageal reflux disease), Hyperlipidemia, Obesity, and Right hip pain.  He also  has a past surgical history that includes Lumbar spine surgery; Cervical spine surgery (2015); Carpal tunnel release (Bilateral); Biceps tendon repair (Right); Cholecystectomy, laparoscopic; Shoulder arthroscopy (Right); Colonoscopy (N/A, 06/28/2023); Pain management procedure (N/A, 01/25/2024); and Total hip arthroplasty (Right, 05/04/2024).    Subjective:   Pt seen in room . Pt reports he is ready to go home    Additional Pertinent History: Pt with hx of R hip AVN s/p R THA via mod Lateral approach      Social Environment Prior Level of Function   Lives With: Family  Type of Home: House  Home Layout: One level  Home Access: Stairs to enter with rails  Bathroom Shower/Tub: Medical sales representative: Handicap height (comfort height)  Bathroom Equipment: Grab bars around toilet, Grab bars in shower  Home Equipment: [place], Environmental consultant - Rolling  Entrance Stairs - Number of Steps: 3  Entrance Stairs - Rails: Both  Prior Level of Assist  for ADLs: Independent  Prior Level of Assist for Homemaking: Independent  Homemaking Responsibilities: Yes  Ambulation Assistance: Independent  Prior Level of Assist for Transfers: Independent  Active Driver: Yes  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, labor  Leisure & Hobbies: likes to cook   History of Falls: Yes (3-4)    Comments:        OBJECTIVE     Vital Signs/Pain Lines/Drains/Precautions   Vital Signs  118/61- in sitting  108/90 post amb    Pain: 5-6/10  Pain Intervention: Ambulation/increased activity , Cold pack, and Repositioned Peripheral IV 05/04/24 Posterior;Right Hand (Active)       Precautions/Restrictions  Restrictions/Precautions  Restrictions/Precautions: Weight Bearing  Lower Extremity Weight Bearing Restrictions  Right Lower Extremity Weight Bearing: Weight Bearing As Tolerated  Position Activity Restriction  Hip Precautions: No active ABduction     Orientation/Cognition Vision/Hearing   Overall Cognitive Status: WNL  Overall Orientation Status: Within Normal Limits  Vision: Impaired  Vision Exceptions: Wears glasses at all times  Hearing: Within Functional Limits        FUNCTIONAL ACTIVITY     MOBILITY LEVEL Complete  I Mod I Supervision/Setup Min A Mod A Max A Comments   Roll Left []  []  []  []  []  []     Roll Right []  []  []  []  []  []     Supine to Sit []  []  [x]  []  []  []     Sit to Supine []  []  []  []  []  []  Left up in chair   Scooting []  []  []  []  []  []   Edge of Bed Assist []  []  []  []  []  []     Transfer Sit to Stand []  []  [x]  []  []  []     Transfer Stand to Sit []  []  [x]  []  []  []     Transfer Bed to/from Chair []  []  []  []  []  []     Transfer Toilet []  []  []  []  []  []       Ambulation    Pt amb x 100 ft with RW with SPV  and quick progression to step through pattern.     Stair Training   # Steps : 4  Stairs Height: 6  Rails: Bilateral  Comment: non reciprocal      Therapeutic Activity (35 Minutes) CPT 97530: Therapeutic activity included Supine to Sit, Transfer Training, Ambulation on level ground,  Stair Training, Sitting balance , and Standing balance to improve functional Activity tolerance, Balance, Mobility, Strength, and ROM.  Safety: Type of Devices: Nurse notified;Call light within reach;Left in chair     Education: Education Given To: Patient  Education Provided Comments: stair training  Education Method: Demonstration  Barriers to Learning: None  Education Outcome: Demonstrated understanding    [employer] AM-PACT "6 Clicks" Basic Mobility Inpatient Short Form   How much help is needed turning from your back to your side while in a flat bed without using bedrails?: A Little  How much help is needed moving from lying on your back to sitting on the side of a flat bed without using bedrails?: A Little  How much help is needed moving to and from a bed to a chair?: A Little  How much help is needed standing up from a chair using your arms?: A Little  How much help is needed walking in hospital room?: A Little  How much help is needed climbing 3-5 steps with a railing?: A Little  AM-PAC Inpatient Mobility Raw Score : 18  AM-PAC Inpatient T-Scale Score : 43.63  Mobility Inpatient CMS 0-100% Score: 46.58  Mobility Inpatient CMS G-Code Modifier : CK     ASSESSMENT   Assessment:    Pt seen in room. Pt states he is eager to get home. Pt able to perform bed mob to L side of bed without rail. Pt requires SPV with transfers and amb and quickly progresses to step through reciprocal pattern. Pt ed on proper stair technique and performed with SPV. Pt is now safe to d/c home with family support.        POST ACUTE RECOMMENDATIONS   Setting: Home Health Therapy    Justification for Recommended Setting: Recommended to help patient obtain maximum functional independence.     Equipment: Equipment Needed: No     Discharge Transportation Recommendations: No stretcher     PLAN   Frequency & Duration: BID for duration of hospital stay or until stated goals are met, whichever comes first.  Patient safe to discharge from PT  standpoint once medically cleared by MD.     Interventions Planned (Treatment may consist of any combination of the following):  Current Treatment Recommendations: Strengthening; ROM; Balance training; Functional mobility training; Transfer training; Endurance training; IADL training; ADL/Self-care training; Gait training; Stair training; Neuromuscular re-education; Pain management; Manual; Return to work related activity; Home exercise program; Safety education & training; Equipment evaluation, education, & procurement; Patient/Caregiver education & training; Modalities; Therapeutic activities      Goals    Short Term Goals Long Term Goals   Time Frame for Short Term Goals: 2 sessions  Short Term Goal 1: Pt  will perform bed mob with SPV while maintaining proper precaution of no active hip abduction without rail- met  Short Term Goal 2: Pt will perform sit to stand with mod I with RW  and WBAT-progressing  Short Term Goal 3: Pt will amb x 150 ft with RW with SPV and WBAT-progressing  Short Term Goal 4: Pt will perform antiembolic ther ex HEP with mod I -progressing Time Frame for Long Term Goals : 4 sessions  Long Term Goal 1: Pt will perform bed mob with mod I while maintaining proper precaution of no active hip abduction.-progressing  Long Term Goal 2: Pt will perform sit to stand  with WBAT with  I-progressing  Long Term Goal 3: Pt  will amb  x 300 ft with RW WBAT with mod I-progressing  Long Term Goal 4: Pt will ascend and descend 4 steps with non reciprocal gait with SPV and B  Rail- met      Therapist Signature: Sharlet Earnie Bourgeois, PT    Date: 05/05/2024

## 2024-05-05 NOTE — Plan of Care (Signed)
 Problem: Musculoskeletal - Adult  Goal: Return mobility to safest level of function  Outcome: Progressing  Goal: Maintain proper alignment of affected body part  Outcome: Progressing     Problem: Respiratory - Adult  Goal: Achieves optimal ventilation and oxygenation  05/05/2024 0806 by Myrna Parody, RN  Outcome: Progressing  05/05/2024 0805 by Myrna Parody, RN  Outcome: Progressing  05/05/2024 0336 by Waddell Shelling, RN  Outcome: Progressing  05/05/2024 0336 by Waddell Shelling, RN  Outcome: Progressing     Problem: ABCDS Injury Assessment  Goal: Absence of physical injury  05/05/2024 0806 by Myrna Parody, RN  Outcome: Progressing  05/05/2024 0805 by Myrna Parody, RN  Outcome: Progressing  05/05/2024 0336 by Waddell Shelling, RN  Outcome: Progressing  05/05/2024 0336 by Waddell Shelling, RN  Outcome: Progressing     Problem: Safety - Adult  Goal: Free from fall injury  05/05/2024 0806 by Myrna Parody, RN  Outcome: Progressing  05/05/2024 0805 by Myrna Parody, RN  Outcome: Progressing  05/05/2024 0336 by Waddell Shelling, RN  Outcome: Progressing  05/05/2024 0336 by Waddell Shelling, RN  Outcome: Progressing     Problem: Discharge Planning  Goal: Discharge to home or other facility with appropriate resources  05/05/2024 0806 by Myrna Parody, RN  Outcome: Progressing  05/05/2024 0805 by Myrna Parody, RN  Outcome: Progressing  Flowsheets (Taken 05/05/2024 0801)  Discharge to home or other facility with appropriate resources: Identify barriers to discharge with patient and caregiver  05/05/2024 0336 by Waddell Shelling, RN  Outcome: Progressing  05/05/2024 0336 by Waddell Shelling, RN  Outcome: Progressing     Problem: Pain  Goal: Verbalizes/displays adequate comfort level or baseline comfort level  05/05/2024 0806 by Myrna Parody, RN  Outcome: Progressing  05/05/2024 0805 by Myrna Parody, RN  Outcome: Progressing  05/05/2024 0336 by Waddell Shelling, RN  Outcome: Progressing  05/05/2024 0336 by Waddell Shelling, RN  Outcome: Progressing

## 2024-05-05 NOTE — Care Coordination-Inpatient (Signed)
 05/05/24 1133   Service Assessment   Patient Orientation Alert and Oriented   Cognition Alert   History Provided By Patient   Primary Caregiver Self   Support Systems Family Members   PCP Verified by CM Yes  Richard Dennis)   Prior Functional Level Independent in ADLs/IADLs   Current Functional Level Independent in ADLs/IADLs   Can patient return to prior living arrangement Yes   Ability to make needs known: Good   Family able to assist with home care needs: Yes   Would you like for me to discuss the discharge plan with any other family members/significant others, and if so, who? 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 Pt is indep at baseline, has good fam support post op. CM confirmed has DME and no pref for HH. Sent ref to [facility] No further CM needs.

## 2024-05-05 NOTE — Progress Notes (Signed)
 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 male admitted with Avascular necrosis of bone of hip, right (HCC).  He  has a past medical history of Anxiety and depression, Asthma, GERD (gastroesophageal reflux disease), Hyperlipidemia, Obesity, and Right hip pain.  He also  has a past surgical history that includes Lumbar spine surgery; Cervical spine surgery (2015); Carpal tunnel release (Bilateral); Biceps tendon repair (Right); Cholecystectomy, laparoscopic; Shoulder arthroscopy (Right); Colonoscopy (N/A, 06/28/2023); Pain management procedure (N/A, 01/25/2024); and Total hip arthroplasty (Right, 05/04/2024).    Subjective:  Pt awake in recliner and agreeable to OT, pt states R hip pain 6.5/10.  RN cleared pt for therapy.          Social Environment  Prior Level of Function ADL  Prior Level of Function IADL    Lives With: Family  Type of Home: House  Home Layout: One level  Home Access: Stairs to enter with rails  Entrance Stairs - Number of Steps: 3  Bathroom Shower/Tub: Medical sales representative: Handicap height (comfort height)  Bathroom Equipment: Grab bars around toilet, Grab bars in shower  Home Equipment: Rexford Finder - Rolling  Prior Level of Assist for Ambulation: Independent community ambulator, with or without device (with cane)  Prior Level of Assist for Transfers: Independent  Prior Level of Assist for ADLs: Independent  Prior  Level of Assist for Homemaking: Independent  Homemaking Responsibilities: Yes  Active Driver: Yes  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, labor  Leisure & Hobbies: likes to cook    History of Falls:Yes (3-4)    Comments:        OBJECTIVE     Vital Signs / Pain Lines & Drains /Precautions       Pain   6.5/10 R hip   Peripheral IV 05/04/24 Posterior;Right Hand (Active)       Precautions/Restrictions  Restrictions/Precautions  Restrictions/Precautions: Weight Bearing  Lower Extremity Weight Bearing Restrictions  Right Lower Extremity Weight Bearing: Weight Bearing As Tolerated  Position Activity Restriction  Hip Precautions: No active ABduction  Other Position/Activity Restrictions: Avoid extreme ROM     Orientation/Cognition Vision/Hearing   Orientation  Orientation Level: Oriented X4  Cognition  Overall Cognitive Status: WNL Hearing  Hearing: Within functional limits  Vision  Vision: Impaired  Vision Exceptions: Wears glasses at all times      Objective Assessment  Gross Assessment    Gross Assessment  AROM: Within functional limits  Strength: Within functional limits  Coordination: Within functional limits        Activities of Daily Living     Feeding: Independent  Grooming: Modified independent   UE Bathing: Modified independent ;Based on clinical judgement (Assumed primarily in sitting for safety at this time)  LE Bathing: Minimal assistance;Based on clinical judgement (Assumed primarily in sitting for safety at this time, pt unable to fully reach feet.)  UE Dressing: Modified independent  (completed don pullover shirt in standing)  LE Dressing: Minimal assistance;Modified independent  (Pt unable to reach feet, following ed in sequencing and use of adaptive equipment pt able to complete mod I.)  Toileting: Modified independent   Functional Mobility: Based on clinical judgement          Functional Activity  Transfers   Sit to stand: Supervision (occasional verbal cuing for hand placement for  safety)  Stand to sit: Modified independent  Toilet - Technique: Ambulating (RW)  Equipment Used: Grab bars  Toilet Transfer: Modified independent       Comments:Transfer Comments: Pt ambulated 26ft x2 with RW and supv/mod I.         Therapeutic Activity Treatment (10 Minutes) CPT 97530: Therapeutic activity included Transfer Training, Ambulation on Level Ground, Lower Body Dressing , and Toileting  to improve functional Activity tolerance, Balance, Mobility, and Increase independence.    Safety: Type of Devices: Nurse notified;Call light within reach;Left in chair     Education: Education Given To: Patient  Education Provided: Role of Therapy;Plan of Care;Transfer Training;ADL Adaptive Strategies;Equipment;Precautions;Fall Prevention Strategies  Education Method: Demonstration;Verbal  Education Outcome: Verbalized understanding;Demonstrated understanding    [employer] AM-PACT "6 Clicks" Basic ADL Inpatient Short Form   How much help is needed for putting on and taking off regular lower body clothing?: None  How much help is needed for bathing (which includes washing, rinsing, drying)?: A Little  How much help is needed for toileting (which includes using toilet, bedpan, or urinal)?: None  How much help is needed for putting on and taking off regular upper body clothing?: None  How much help is needed for taking care of personal grooming?: None  How much help for eating meals?: None  AM-PAC Inpatient Daily Activity Raw Score: 23  AM-PAC Inpatient ADL T-Scale Score : 51.12  ADL Inpatient CMS 0-100% Score: 15.86  ADL Inpatient CMS G-Code Modifier : CI     ASSESSMENT   Assessment   Pt semi-supine in bed upon therapist arrival, states R hip pain 6.5/10 and is agreeable to OT.  Pt A&O x4, cognition WNL.  B UE AROM and strength WFL.  Pt tolerated session well, participating in adl and mobility tasks as noted above.  Pt ed in sequencing and use of adaptive equipment for lower body dressing, demonstrated good  understanding.  Reacher issued, pt ed in acquiring sock aide.  Pt also ed in home/bathroom safety and options for shower DME, pt to investigate further with HH.  At end of session pt seated in recliner with needs in reach, B LE elevated, and ice to R hip.  No further OT needs at this time.      Evaluation Complexity:  Low Complexity      POST ACUTE RECOMMENDATIONS   Setting: No further OT needs at this time    Equipment: Equipment Needed:  (May need shower DME, pt understands and will acquire if needed.)      PLAN     Mr. Michel presents with Good therapy prognosis. Skilled intervention is medically necessary to address the following performance deficits: Decreased functional mobility , Decreased ADL status.  Benefits and precautions of occupational therapy have been discussed with Mr. Knee, and the following interventions are recommended: Functional mobility training, Pain management, Safety education & training, Patient/Caregiver education & training, Modalities, Positioning, Equipment evaluation, education, & procurement, Self-Care / ADL.    Goals    Short Term Goals Long Term Goals  Time Frame for Short Term Goals: x1 visit  Short Term Goal 1: Pt will demonstrate good understanding of use of adaptive equipment for lower body dressing.  -goal met (mod I following ed) Time Frame for Long Term Goals : x1 visit  Long Term Goal 1: Pt will complete toilet transfer mod I.  -goal met      Therapist Signature: Emmie Shawnee Moats, OT    Date: 05/05/2024

## 2024-05-05 NOTE — Discharge Summary (Signed)
 Koren GEANNIE Littler, Mickey., PA-C  Phone [phone]  Fax [phone]    [address] [facility]  [address]  [place], [place]  [address]  [place], [place]  [address]    Adult Hip and Knee Reconstruction Service      Patient Name:  Richard Dennis  Date of birth:  1966/05/07   MRN:  [id number]   Date: 05/07/24      Discharge Summary      Admission Date:     05/04/2024    Admitting Physician:     Glendia Belts, MD     Admission Diagnoses:     Right hip pain  Avascular Necrosis of Right Hip    Indication for Admission:     The patient developed severe Right hip pain due to their history of severe Avascular Necrosis of Right Hip. The patient's pain progressed to the point where it limited their daily activity and quality of life. The patient failed conservative treatment and elected to proceed with right total hip arthroplasty.    Operative Procedure:     Right THA Lateral Approach    Hospital Course:     Arlyn Buerkle is a pleasant 58 y.o. male who was admitted to [facility] following their right THA Lateral Approach on 05/04/2024. Patient tolerated the procedure well and without complications. They received 24 hours of prophylactic intravenous antibiotics without complication.  DVT prophylaxis to include SCDs, early ambulation, and Aspirin  was initiated post-operatively. The patient tolerated their Aspirin  well and without complication. The patient was able to tolerate a regular diet and their pain was reasonably well controlled. They tolerated their pain medication well and without complication. Patient denied any fever, chills, chest pain, and SOB their entire hospital stay. The patient progressed well with therapy and continues to do so. The patient is tolerating PO pain medication well with pain reasonably controlled, patient has resumed a regular diet, patient is voiding independently, and the patient has met their therapy goals. The patient was deemed stable and may be discharged from an Ortho  standpoint.    Physical Exam:     BP (!) 105/58   Pulse 64   Temp 98.6 F (37 C) (Oral)   Resp 15   Ht 1.727 m (5' 7.99)   Wt 128.8 kg (283 lb 15.2 oz)   SpO2 95%   BMI 43.18 kg/m      Intake and Output Summary (Last 24 hours) at Date Time  No intake or output data in the 24 hours ending 05/07/24 1231    General examination:  GENERAL APPEARANCE:   Well developed, well nourished male, AOx3, in no acute distress  PULMONOLOGY:   No acute distress, breathing comfortably on room air  MUSCULOSKELETAL:   Dressing C/D/I   No obvious deformity  Fires Quad/TA/EHL/GSC  NEUROVASCULAR:   Palpable DP/PT pulses   SILT Superficial Peroneal/Deep Peroneal/Tibial/Sural/Saphenous   Brisk Capillary Refill  Calves soft, nontender bilateral  No obvious evidence of DVT    Labs:     CBC:   Lab Results   Component Value Date/Time    WBC 18.0 05/05/2024 05:11 AM    RBC 4.24 05/05/2024 05:11 AM    HGB 13.2 05/05/2024 05:11 AM    HCT 41.2 05/05/2024 05:11 AM    MCV 97.2 05/05/2024 05:11 AM    MCH 31.1 05/05/2024 05:11 AM    MCHC 32.0 05/05/2024 05:11 AM    RDW 13.3 05/05/2024 05:11 AM    PLT 222 05/05/2024  05:11 AM    MPV 9.9 05/05/2024 05:11 AM     BMP:    Lab Results   Component Value Date/Time    NA 137 05/05/2024 05:11 AM    K 5.0 05/05/2024 05:11 AM    CL 101 05/05/2024 05:11 AM    CO2 23 05/05/2024 05:11 AM    BUN 17 05/05/2024 05:11 AM    CREATININE 1.0 05/05/2024 05:11 AM    CALCIUM  8.5 05/05/2024 05:11 AM    LABGLOM 88 05/05/2024 05:11 AM    LABGLOM 110 09/29/2022 07:18 AM    GLUCOSE 132 05/05/2024 05:11 AM       Imaging:     Radiological Procedure personally reviewed by me.    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  Post-op Right Total Hip Arthroplasty Lateral Approach    COMPARISON: March 22, 2024    FINDINGS: Right total hip arthroplasty. No evidence of hardware loosening or  failure.  Postoperative changes of the lumbar spine incompletely assessed.    Impression  Expected immediate postoperative changes following right total hip arthroplasty.         Discharge Diagnoses:     Right hip pain  Avascular Necrosis of Right Hip  S/P Right THA Lateral Approach    Discharge Medications:        Medication List        PAUSE taking these medications      ibuprofen 800 MG tablet  Wait to take this until your doctor or other care provider tells you to start again.  Commonly known as: ADVIL;MOTRIN            START taking these medications      acetaminophen  500 MG tablet  Commonly known as: TYLENOL   Take 2 tablets by mouth every 6 hours as needed for Pain Do Not Exceed 4000mg  per day     aspirin  81 MG EC tablet  Commonly known as: Aspirin  81  Take 1 tablet by mouth in the morning and at bedtime Blood Clot Prevention     bisacodyl  5 MG EC tablet  Commonly known as: DULCOLAX  Take 2 tablets by mouth daily as needed for Constipation     celecoxib  200 MG capsule  Commonly known as: CeleBREX   Take 1 capsule by mouth 2 times daily     gabapentin  300 MG capsule  Commonly known as: NEURONTIN   Take 1 capsule by mouth 3 times daily for 14 days.     naloxone  4 MG/0.1ML Liqd nasal spray  1 spray by Nasal route as needed for Opioid Reversal     oxyCODONE  5 MG immediate release tablet  Commonly known as: Roxicodone   Take 1 tablet by mouth every 4 hours as needed for Pain for up to 5 days. Intended supply: 5 days. Take lowest dose possible to manage pain Max Daily Amount: 30 mg     tiZANidine  4 MG tablet  Commonly known as: ZANAFLEX   Take 1 tablet by mouth every 6 hours as needed (Muscle Spasms) DO NOT TAKE WITH NARCOTIC PAIN MEDICATION. SEPARATE MEDICATIONS BY AT LEAST 2 HOURS.            CHANGE how you take these medications      omeprazole  20 MG delayed release capsule  Commonly known as: PRILOSEC  TAKE 1 CAPSULE BY MOUTH EVERY DAY  What changed: how much to  take            CONTINUE taking these medications      albuterol   sulfate HFA 108 (90 Base) MCG/ACT inhaler  Commonly known as: PROVENTIL ;VENTOLIN ;PROAIR   Inhale 2 puffs into the lungs every 4 hours as needed for Wheezing     atorvastatin  10 MG tablet  Commonly known as: LIPITOR   TAKE 1 TABLET BY MOUTH EVERY DAY     baclofen  10 MG tablet  Commonly known as: LIORESAL      busPIRone  10 MG tablet  Commonly known as: BUSPAR   Take 1 tablet by mouth 2 times daily     FLUoxetine  10 MG capsule  Commonly known as: PROZAC   Take 1 capsule by mouth daily     lamoTRIgine  150 MG tablet  Commonly known as: LAMICTAL   Take 1 tablet by mouth daily     Magnesium  400 MG Caps     OLANZapine  15 MG tablet  Commonly known as: ZYPREXA   Take 1 tablet by mouth nightly          Medication Instructions:    All post-op prescriptions have been E-prescribed to your pharmacy.            Where to Get Your Medications        These medications were sent to [id number] [place] - [address] - P [phone] - F [phone]  [address], [place] [address]      Phone: [phone]   acetaminophen  500 MG tablet  aspirin  81 MG EC tablet  bisacodyl  5 MG EC tablet  celecoxib  200 MG capsule  gabapentin  300 MG capsule  naloxone  4 MG/0.1ML Liqd nasal spray  oxyCODONE  5 MG immediate release tablet  tiZANidine  4 MG tablet         Discharge Date and Time:     05/05/2024  1:01 PM     Patient Instructions:     Activity:    WBAT on the operative side. No forced/active hip abduction for 6 weeks. Work on obtaining full extension and maximizing your flexion post-operatively with PT. Perform home exercises daily to work on hip range of motion and strengthening. Ice and elevate the operative side regularly to help with pain and swelling. You will be contacted by the home health agency or outpatient physical therapy office that you chose to begin therapy. Please contact the office if you have not heard anything within 48 hours.    DVT Prophylaxis:   You will be given Aspirin  to reduce the  risk of blood clots. Please take the medication as prescribed and ambulate every hour to reduce your risk of developing a DVT/PE.    Wound Care:   Please keep your dressing clean and dry. You may shower but do not take any tub baths or fully submerge the surgical site until cleared by your provider to do so. You may remove your dressing after 7 days and as long as there is no drainage than you may leave your incision open to room air. If there is any drainage at the time the dressing is removed, please contact the office.    Additional Instructions:   Please contact the office if you have any fever, chills, increased redness, increased warmth, bleeding, drainage, signs of infection, progressively worsening pain, or other concerning symptoms.  You should report to the emergency room immediately for any chest pain or shortness of breath.      Follow-up:     4  weeks    Electronically signed by Koren GEANNIE Littler, Jr. PA-C 05/07/2024 at 12:31 PM.

## 2024-05-08 NOTE — Telephone Encounter (Signed)
 Will be seen for home PT 3 x weekly for 2 weeks and then 2 x weekly for 2 weeks. Thank you

## 2024-05-08 NOTE — Telephone Encounter (Signed)
 Noted

## 2024-06-01 ENCOUNTER — Encounter

## 2024-06-06 ENCOUNTER — Ambulatory Visit: Admit: 2024-06-06 | Discharge: 2024-06-06 | Payer: Medicaid (Managed Care) | Primary: Physician Assistant

## 2024-06-06 ENCOUNTER — Ambulatory Visit
Admit: 2024-06-06 | Discharge: 2024-06-06 | Payer: Medicaid (Managed Care) | Attending: Physician Assistant | Primary: Physician Assistant

## 2024-06-06 DIAGNOSIS — Z96641 Presence of right artificial hip joint: Principal | ICD-10-CM

## 2024-06-06 NOTE — Progress Notes (Unsigned)
 Richard Dennis, Mickey., PA-C  Phone [phone]  Fax [phone]    [address] [facility]             [address]  [place], [place]  [address]  [place], [place]  [address]        Subjective:   Reason for Visit:    Post-Op Check (SX 8/7 LT THA)      History of Present Illness:   Cj Edgell  is a pleasant 58 y.o. male who presents today to follow-up {his/her:30013316} {TNMJr Hip Side:48554} pain. Patient is *** weeks s/p {Right/Left:10555} THA Lateral Approach, DOS 05/04/2024. Patient reports being in HHPT and progressing well. Patient denies any fever, chills, chest pain, SOB, new falls, or injuries since surgery. The patient states that they continue to take their {TNMJr Blood Thinner:48892} regimen without complication and their pain is controlled with their pain medication/OTC Tylenol . The patient reports that {he/she:42290} continues to use {his/her:30013316} cane/walker for safe ambulation/denies use of any assistive devices for safe ambulation. The patient is requesting a refill of their post-op medications today.    Review of Systems   Past Medical History:   Diagnosis Date    Anxiety and depression     Asthma     GERD (gastroesophageal reflux disease)     Hyperlipidemia     Obesity     Right hip pain      Past Surgical History:   Procedure Laterality Date    BICEPS TENDON REPAIR Right     CARPAL TUNNEL RELEASE Bilateral     CERVICAL SPINE SURGERY  2015    3 level ACDF    CHOLECYSTECTOMY, LAPAROSCOPIC      COLONOSCOPY N/A 06/28/2023    COLONOSCOPY BIOPSY performed by Ladean Carliss Rush, MD at [facility] ENDOSCOPY    LUMBAR SPINE SURGERY      x 2 fusions    PAIN MANAGEMENT PROCEDURE N/A 01/25/2024    LESI performed by Floretta Lamar PARAS, MD at [facility] CORNER AMBULATORY OR    SHOULDER ARTHROSCOPY Right     TOTAL HIP ARTHROPLASTY Right 05/04/2024    RIGHT TOTAL HIP ARTHROPLASTY 72869 performed by Marykay Hamilton, MD at [facility] MAIN OR     Social History     Socioeconomic History    Marital  status: Widowed     Spouse name: Not on file    Number of children: Not on file    Years of education: Not on file    Highest education level: Not on file   Occupational History    Not on file   Tobacco Use    Smoking status: Former     Current packs/day: 0.00     Average packs/day: 1 pack/day for 20.0 years (20.0 ttl pk-yrs)     Types: Cigarettes     Start date: 08/2002     Quit date: 08/2022     Years since quitting: 1.7     Passive exposure: Never    Smokeless tobacco: Never   Vaping Use    Vaping status: Never Used   Substance and Sexual Activity    Alcohol use: Not Currently     Comment: quit in 2003    Drug use: Never    Sexual activity: Defer   Other Topics Concern    Not on file   Social History Narrative    Not on file     Social Drivers of Health     Financial Resource Strain: Not on  file   Food Insecurity: No Food Insecurity (05/04/2024)    Hunger Vital Sign     Worried About Running Out of Food in the Last Year: Never true     Ran Out of Food in the Last Year: Never true   Transportation Needs: No Transportation Needs (05/04/2024)    PRAPARE - Therapist, art (Medical): No     Lack of Transportation (Non-Medical): No   Physical Activity: Not on file   Stress: Not on file   Social Connections: Not on file   Intimate Partner Violence: Not on file   Housing Stability: Low Risk  (05/04/2024)    Housing Stability Vital Sign     Unable to Pay for Housing in the Last Year: No     Number of Times Moved in the Last Year: 0     Homeless in the Last Year: No      No family history on file.  No Known Allergies    Objective:   There were no vitals taken for this visit.     General: Well-developed, well-nourished male, A&O x3, in no acute distress. Normal mood and affect, cooperative, and appropriate attitude.    Pulmonology: Regular, unlabored, in no acute distress, breathing comfortably on room air.    Lateral based {TNMJr Hip Side:48554} incision C/D/I and healing well.  No warmth, erythema,  induration, or discharge. No obvious deformity, ecchymosis, rash, or signs of infection. {TNMJr Severity:48893} pain at upper limits of hip ROM. {TNMJr Severity:48893} {TNMJr Hip Side:48554} swelling with {TNMJr Severity:48893} RLE edema. {Right/Left:10555} calf is soft and nontender.  No evidence of DVT. RLE neurovascular intact with sensation intact to light touch and palpable DP/PT pulses. Fires Quad/TA/GSC/EHL. Ambulating {with-without:20807} {TNMJr Assistive Device:49185}. {TNMJr Severity:48893} limp when ambulating without assistive device.      Imaging:    Xray Result (most recent):  XR HIP RIGHT (2-3 VIEWS) 06/06/2024    Narrative  X-ray reveals a right hip prosthesis in acceptable alignment and without evidence of complication.  There is also interval lumbar fusion hardware noted in acceptable alignment and without evidence of complication.           Assessment     1. S/P total right hip arthroplasty         Plan     I reviewed the hip x-rays with the patient today. X-rays revealed {left/right:42266} hip prosthesis in acceptable alignment and without evidence of complication. Patient is doing well following their THA. The patient will complete their current course of HHPT, transition to a course of outpatient physical therapy, and progress with activity as tolerated. Physical therapy can be discontinued when their therapy goals are met. I recommend that the patient continue to work on hip ROM and strengthening exercises at home. The patient may continue to progress with WBAT on the operative side but should continue to avoid forced abduction. Patient should continue with appropriate wound care. Patient may discharge their {TNMJr Blood Thinner:48892} regimen for DVT prophylaxis. Patient may wean off of the cane, but may continue to use it PRN for comfort and stability. We discussed that we do not recommend any dental procedures for 3 months following surgery, unless it is an emergency, and recommend  prophylactic Abx from then on for any dental procedures or colonoscopies. {HIS/HER:40557} post-op meds were refilled today. I hereby declare that [place] database was verified to best of my knowledge prior to prescribing this controlled substance medication. All of the patient's questions  were answered. We will see the patient back in 3 months.       Follow-up     ***    Electronically signed by Richard GEANNIE Governor Mickey. PA-C 06/06/2024 at 6:24 PM.

## 2024-09-05 ENCOUNTER — Ambulatory Visit: Admit: 2024-09-05 | Discharge: 2024-09-05 | Payer: Medicaid (Managed Care) | Primary: Physician Assistant

## 2024-09-05 ENCOUNTER — Ambulatory Visit
Admit: 2024-09-05 | Discharge: 2024-09-05 | Payer: Medicaid (Managed Care) | Attending: Physician Assistant | Primary: Physician Assistant

## 2024-09-05 DIAGNOSIS — Z96641 Presence of right artificial hip joint: Principal | ICD-10-CM

## 2024-09-05 NOTE — Progress Notes (Signed)
 "      Koren GEANNIE Governor Mickey., PA-C  Phone [phone]  Fax [phone]    [address] [facility]             [address]  [place], [place]  [address]  [place], [place]  [address]        Subjective:   Reason for Visit:    Post-Op Check (SX RT THA 05/04/24)      History of Present Illness:   Richard Dennis  is a pleasant 58 y.o. male who presents today to follow-up his right hip pain. Patient is 4 months s/p Right THA Lateral Approach, DOS 05/04/2024. Patient reports that they have completed their PT, continues their HEP, and continues to progress well. Patient denies any fever, chills, chest pain, SOB, new falls, or injuries since their last visit. The patient denies right hip pain today and denies use of any assistive devices for safe ambulation.    Review of Systems   Past Medical History:   Diagnosis Date    Anxiety and depression     Asthma     GERD (gastroesophageal reflux disease)     Hyperlipidemia     Obesity     Right hip pain      Past Surgical History:   Procedure Laterality Date    BICEPS TENDON REPAIR Right     CARPAL TUNNEL RELEASE Bilateral     CERVICAL SPINE SURGERY  2015    3 level ACDF    CHOLECYSTECTOMY, LAPAROSCOPIC      COLONOSCOPY N/A 06/28/2023    COLONOSCOPY BIOPSY performed by Ladean Carliss Rush, MD at [facility] ENDOSCOPY    LUMBAR SPINE SURGERY      x 2 fusions    PAIN MANAGEMENT PROCEDURE N/A 01/25/2024    LESI performed by Floretta Lamar PARAS, MD at [facility]NER AMBULATORY OR    SHOULDER ARTHROSCOPY Right     TOTAL HIP ARTHROPLASTY Right 05/04/2024    RIGHT TOTAL HIP ARTHROPLASTY 72869 performed by Marykay Hamilton, MD at [facility] MAIN OR     Social History     Socioeconomic History    Marital status: Widowed     Spouse name: Not on file    Number of children: Not on file    Years of education: Not on file    Highest education level: Not on file   Occupational History    Not on file   Tobacco Use    Smoking status: Former     Current packs/day: 0.00     Average packs/day: 1 pack/day  for 20.0 years (20.0 ttl pk-yrs)     Types: Cigarettes     Start date: 08/2002     Quit date: 08/2022     Years since quitting: 2.0     Passive exposure: Never    Smokeless tobacco: Never   Vaping Use    Vaping status: Never Used   Substance and Sexual Activity    Alcohol use: Not Currently     Comment: quit in 2003    Drug use: Never    Sexual activity: Defer   Other Topics Concern    Not on file   Social History Narrative    Not on file     Social Drivers of Health     Financial Resource Strain: Not on file   Food Insecurity: No Food Insecurity (05/04/2024)    Hunger Vital Sign     Worried About Running Out of Food  in the Last Year: Never true     Ran Out of Food in the Last Year: Never true   Transportation Needs: No Transportation Needs (05/04/2024)    PRAPARE - Therapist, Art (Medical): No     Lack of Transportation (Non-Medical): No   Physical Activity: Not on file   Stress: Not on file   Social Connections: Not on file   Intimate Partner Violence: Not on file   Housing Stability: Low Risk  (05/04/2024)    Housing Stability Vital Sign     Unable to Pay for Housing in the Last Year: No     Number of Times Moved in the Last Year: 0     Homeless in the Last Year: No      History reviewed. No pertinent family history.  No Known Allergies    Objective:   There were no vitals taken for this visit.     General: Well-developed, well-nourished male, A&O x3, in no acute distress. Normal mood and affect, cooperative, and appropriate attitude.    Pulmonology: Regular, unlabored, in no acute distress, breathing comfortably on room air.    Lateral based right hip incision C/D/I and well healed.  No warmth, erythema, induration, or discharge. No obvious deformity, ecchymosis, rash, or signs of infection. No pain with hip ROM. No right hip swelling with No RLE edema. Right calf is soft and nontender.  No evidence of DVT. RLE neurovascular intact with sensation intact to light touch and palpable DP/PT  pulses. Fires Quad/TA/GSC/EHL. Ambulating without assistive device. No limp when ambulating without assistive device.      Imaging:    Xray Result (most recent):  XR HIP RIGHT (2-3 VIEWS) 09/05/2024    Narrative  X-ray reveals a right hip prosthesis in acceptable alignment and without evidence of complication.  There is also mild femoral acetabular degenerative changes of the left hip noted.  There is also interval lumbar fusion hardware noted in acceptable alignment and without evidence of hardware complication.           Assessment     1. S/P total right hip arthroplasty         Plan     I reviewed the hip x-rays with the patient today. X-rays revealed right hip prosthesis in acceptable alignment and without evidence of complication. Patient is doing well following their THA. His pain, ROM, and function have continued to improve since their last visit. The patient has completed their physical therapy and continues to progress with activity as tolerated. I recommend that the patient continue to work on hip ROM and strengthening exercises at home. The patient may continue to progress with WBAT on the operative side and should continue with appropriate wound care. We discussed that full recovery could take up to a full year. The patient may now proceed with any dental procedures or colonoscopies as needed. However, we again discussed that we recommend prophylactic Abx from now on for any dental procedures or colonoscopies. We discussed that we recommend repeat x-rays every five years to monitor for loosening or wear. All of the patient's questions were answered. We will see the patient back at their five year follow-up unless they need to be seen sooner in terms of their right hip.       Follow-up     5 year follow-up - August 2030    Electronically signed by Koren GEANNIE Littler, Jr. PA-C 09/05/2024 at 5:06 PM.  "
